# Patient Record
Sex: Female | Born: 1990 | Race: White | Hispanic: No | Marital: Single | Smoking: Never smoker
Health system: Southern US, Community
[De-identification: ages and names within clinical notes are randomized; demographics above are authoritative.]

## PROBLEM LIST (undated history)

## (undated) DIAGNOSIS — G43909 Migraine, unspecified, not intractable, without status migrainosus: Secondary | ICD-10-CM

## (undated) DIAGNOSIS — B009 Herpesviral infection, unspecified: Secondary | ICD-10-CM

## (undated) DIAGNOSIS — F32A Depression, unspecified: Secondary | ICD-10-CM

## (undated) DIAGNOSIS — F419 Anxiety disorder, unspecified: Secondary | ICD-10-CM

## (undated) DIAGNOSIS — F329 Major depressive disorder, single episode, unspecified: Secondary | ICD-10-CM

## (undated) HISTORY — PX: NO PAST SURGERIES: SHX2092

## (undated) HISTORY — DX: Herpesviral infection, unspecified: B00.9

## (undated) HISTORY — DX: Migraine, unspecified, not intractable, without status migrainosus: G43.909

---
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
## 2012-05-16 ENCOUNTER — Emergency Department (HOSPITAL_COMMUNITY)
Admission: EM | Admit: 2012-05-16 | Discharge: 2012-05-16 | Disposition: A | Discharge status: Home / Self Care | Payer: Medicaid Other | Attending: Emergency Medicine | Admitting: Emergency Medicine

## 2012-05-16 ENCOUNTER — Encounter (HOSPITAL_COMMUNITY): Payer: Self-pay | Admitting: *Deleted

## 2012-05-16 DIAGNOSIS — T169XXA Foreign body in ear, unspecified ear, initial encounter: Secondary | ICD-10-CM

## 2012-05-16 DIAGNOSIS — H9209 Otalgia, unspecified ear: Secondary | ICD-10-CM | POA: Insufficient documentation

## 2012-05-16 NOTE — ED Notes (Signed)
Pt denies symptoms at this time.  Discharged.

## 2012-05-16 NOTE — ED Notes (Signed)
Per parent, Pt woke up tonight with left ear hurting, pt also c/o dizziness and "feels like something is crawling down in her ear".

## 2012-05-16 NOTE — ED Provider Notes (Signed)
History     CSN: [id number]  Arrival date & time 05/16/12  0443   First MD Initiated Contact with Patient 05/16/12 0451      Chief Complaint  Patient presents with  . Earache     (Consider location/radiation/quality/duration/timing/severity/associated sxs/prior treatment) HPI This is an 21 year old white female who awoke just prior to arrival with severe pain in her left ear. She states it felt like something was crawling in her ear. This was accompanied by sensation of the room spinning. She became very anxious and hyperventilated. She had a near syncopal episode. On the way to the ED the symptoms resolved. She is without pain or other symptoms at the present time.  History reviewed. No pertinent past medical history.  History reviewed. No pertinent past surgical history.  History reviewed. No pertinent family history.  History  Substance Use Topics  . Smoking status: Never Smoker   . Smokeless tobacco: Not on file  . Alcohol Use: No    OB History    Grav Para Term Preterm Abortions TAB SAB Ect Mult Living                  Review of Systems  All other systems reviewed and are negative.    Allergies  Review of patient's allergies indicates no known allergies.  Home Medications   Current Outpatient Rx  Name Route Sig Dispense Refill  . NORETHIN ACE-ETH ESTRAD-FE 1-20 MG-MCG PO TABS Oral Take 1 tablet by mouth daily.      BP 134/65  Pulse 80  Temp 98 F (36.7 C) (Oral)  Resp 20  SpO2 100%  LMP 05/09/2012  Physical Exam General: Well-developed, well-nourished female in no acute distress; appearance consistent with age of record HENT: normocephalic, atraumatic; tympanic membranes normal bilaterally; no foreign objects seen in ears; no pain on movement of external ears; no drainage, bleeding or edema of the external auditory canal seen Eyes: pupils equal round and reactive to light; extraocular muscles intact Neck: supple Heart: regular rate and  rhythm Lungs: Normal respiratory effort and excursion Abdomen: soft; nondistended Extremities: No deformity; full range of motion Neurologic: Awake, alert and oriented; motor function intact in all extremities and symmetric; no facial droop Skin: Warm and dry     ED Course  Procedures (including critical care time)     MDM  Suspect patient had a insect in her ear that subsequently exited, hence the sudden onset and resolution of symptoms.         Carlisle Beers Cara Thaxton, MD 05/16/12 0500

## 2012-12-19 ENCOUNTER — Encounter (HOSPITAL_COMMUNITY): Payer: Self-pay

## 2012-12-19 ENCOUNTER — Emergency Department (HOSPITAL_COMMUNITY)
Admission: EM | Admit: 2012-12-19 | Discharge: 2012-12-19 | Disposition: A | Discharge status: Home / Self Care | Payer: BC Managed Care – PPO | Attending: Emergency Medicine | Admitting: Emergency Medicine

## 2012-12-19 DIAGNOSIS — B009 Herpesviral infection, unspecified: Secondary | ICD-10-CM | POA: Insufficient documentation

## 2012-12-19 DIAGNOSIS — Z79899 Other long term (current) drug therapy: Secondary | ICD-10-CM | POA: Insufficient documentation

## 2012-12-19 MED ORDER — ACYCLOVIR 800 MG PO TABS: 800.0000 mg | ORAL_TABLET | Freq: Every day | ORAL | Status: DC

## 2012-12-19 NOTE — ED Notes (Signed)
Pt with rash to right axillary, pt states at first it itched but now is painful, states ongoing since Friday, mother states that PCP stated it was a bug bite

## 2012-12-19 NOTE — ED Notes (Signed)
Rash to right axillary area since Friday. Itching at first and now just burns.

## 2012-12-20 NOTE — ED Provider Notes (Signed)
History     CSN: [id number]  Arrival date & time 12/19/12  1258   First MD Initiated Contact with Patient 12/19/12 1316      Chief Complaint  Patient presents with  . Rash    (Consider location/radiation/quality/duration/timing/severity/associated sxs/prior treatment) HPI Comments: Patient c/o painful, red, itching rash to her right axilla.  States she had itching to the same area prior to the rash.  Describes the pain as a burning , stinging sensation.  She denies recent fever, malaise, chills   Patient is a 22 y.o. female presenting with rash. The history is provided by the patient and a parent.  Rash  This is a new problem. Episode onset: 2-3 days. The problem has not changed since onset.The problem is associated with an unknown factor. There has been no fever. The rash is present on the torso. The pain is mild. The pain has been constant since onset. Associated symptoms include blisters, itching and pain. Pertinent negatives include no weeping. She has tried nothing for the symptoms. The treatment provided no relief.    History reviewed. No pertinent past medical history.  History reviewed. No pertinent past surgical history.  No family history on file.  History  Substance Use Topics  . Smoking status: Never Smoker   . Smokeless tobacco: Not on file  . Alcohol Use: No    OB History    Grav Para Term Preterm Abortions TAB SAB Ect Mult Living                  Review of Systems  Constitutional: Negative for fever, chills, activity change and appetite change.  HENT: Negative for sore throat, facial swelling, trouble swallowing, neck pain and neck stiffness.   Respiratory: Negative for chest tightness, shortness of breath and wheezing.   Musculoskeletal: Negative for myalgias, back pain, joint swelling, arthralgias and gait problem.  Skin: Positive for color change, itching and rash. Negative for wound.  Neurological: Negative for dizziness, weakness, numbness and  headaches.  All other systems reviewed and are negative.    Allergies  Review of patient's allergies indicates no known allergies.  Home Medications   Current Outpatient Rx  Name  Route  Sig  Dispense  Refill  . LISDEXAMFETAMINE DIMESYLATE 30 MG PO CAPS   Oral   Take 30 mg by mouth every morning.         Marland Kitchen MEDROXYPROGESTERONE ACETATE 150 MG/ML IM SUSP   Intramuscular   Inject 150 mg into the muscle every 3 (three) months.         . ACYCLOVIR 800 MG PO TABS   Oral   Take 1 tablet (800 mg total) by mouth 5 (five) times daily. For 7 days   35 tablet   0     BP 111/65  Pulse 65  Temp 98 F (36.7 C) (Oral)  Resp 20  Ht 5\' 1"  (1.549 m)  Wt 100 lb (45.36 kg)  BMI 18.89 kg/m2  SpO2 100%  Physical Exam  Nursing note and vitals reviewed. Constitutional: She is oriented to person, place, and time. She appears well-developed and well-nourished. No distress.  HENT:  Head: Normocephalic and atraumatic.  Mouth/Throat: Oropharynx is clear and moist.  Neck: Normal range of motion. Neck supple.  Cardiovascular: Normal rate, regular rhythm and normal heart sounds.   Pulmonary/Chest: Effort normal and breath sounds normal.  Musculoskeletal: She exhibits no edema and no tenderness.  Lymphadenopathy:    She has no cervical adenopathy.  Neurological: She is  alert and oriented to person, place, and time. She exhibits normal muscle tone. Coordination normal.  Skin: Rash noted. There is erythema.          group of vesicular lesions to the right lateral axilla.  Few satellite red papules also present    ED Course  Procedures (including critical care time)  Labs Reviewed - No data to display No results found.   1. Herpes infection       MDM    Localized , group of vesicular lesions to the right lateral axilla.  Few satellite red papules also present extending to the scapula.  Appear c/w herpes infection.  Patient advised that shingles is possible although unlikely at  her age.  Patient is otherwise well appearing and vitals are stable.  No other lesions seen on exam.  Will  Begin antiviral medication and pt agrees to f/u with her PMD if needed      Arish Redner L. [place], [place] 12/20/12 2214

## 2012-12-21 NOTE — ED Provider Notes (Signed)
Medical screening examination/treatment/procedure(s) were performed by non-physician practitioner and as supervising physician I was immediately available for consultation/collaboration. Devoria Albe, MD, FACEP   Ward Givens, MD 12/21/12 [phone]

## 2013-04-18 ENCOUNTER — Ambulatory Visit: Payer: Self-pay | Admitting: Pediatrics

## 2014-05-02 ENCOUNTER — Ambulatory Visit (INDEPENDENT_AMBULATORY_CARE_PROVIDER_SITE_OTHER): Payer: BC Managed Care – PPO | Admitting: Pediatrics

## 2014-05-02 ENCOUNTER — Emergency Department (HOSPITAL_COMMUNITY)
Admission: EM | Admit: 2014-05-02 | Discharge: 2014-05-02 | Disposition: A | Discharge status: Home / Self Care | Payer: BC Managed Care – PPO | Attending: Emergency Medicine | Admitting: Emergency Medicine

## 2014-05-02 ENCOUNTER — Encounter (HOSPITAL_COMMUNITY): Payer: Self-pay | Admitting: Emergency Medicine

## 2014-05-02 ENCOUNTER — Encounter: Payer: Self-pay | Admitting: Pediatrics

## 2014-05-02 VITALS — BP 100/76 | Wt 103.4 lb

## 2014-05-02 DIAGNOSIS — Z3202 Encounter for pregnancy test, result negative: Secondary | ICD-10-CM | POA: Insufficient documentation

## 2014-05-02 DIAGNOSIS — F419 Anxiety disorder, unspecified: Secondary | ICD-10-CM

## 2014-05-02 DIAGNOSIS — Z79899 Other long term (current) drug therapy: Secondary | ICD-10-CM | POA: Insufficient documentation

## 2014-05-02 DIAGNOSIS — R000 Tachycardia, unspecified: Secondary | ICD-10-CM | POA: Insufficient documentation

## 2014-05-02 DIAGNOSIS — F329 Major depressive disorder, single episode, unspecified: Secondary | ICD-10-CM | POA: Insufficient documentation

## 2014-05-02 DIAGNOSIS — F3289 Other specified depressive episodes: Secondary | ICD-10-CM | POA: Insufficient documentation

## 2014-05-02 DIAGNOSIS — F411 Generalized anxiety disorder: Secondary | ICD-10-CM

## 2014-05-02 DIAGNOSIS — E860 Dehydration: Secondary | ICD-10-CM | POA: Insufficient documentation

## 2014-05-02 HISTORY — DX: Major depressive disorder, single episode, unspecified: F32.9

## 2014-05-02 HISTORY — DX: Anxiety disorder, unspecified: F41.9

## 2014-05-02 HISTORY — DX: Depression, unspecified: F32.A

## 2014-05-02 LAB — COMPREHENSIVE METABOLIC PANEL
ALT: 7 U/L (ref 0–35)
AST: 14 U/L (ref 0–37)
Albumin: 4.6 g/dL (ref 3.5–5.2)
Alkaline Phosphatase: 58 U/L (ref 39–117)
BUN: 9 mg/dL (ref 6–23)
CHLORIDE: 101 meq/L (ref 96–112)
CO2: 23 mEq/L (ref 19–32)
CREATININE: 0.75 mg/dL (ref 0.50–1.10)
Calcium: 9.3 mg/dL (ref 8.4–10.5)
GFR calc Af Amer: >90 mL/min (ref >90)
GFR calc non Af Amer: >90 mL/min (ref >90)
Glucose, Bld: 109 mg/dL — ABNORMAL HIGH (ref 70–99)
Potassium: 3.8 mEq/L (ref 3.7–5.3)
Sodium: 140 mEq/L (ref 137–147)
Total Bilirubin: 0.3 mg/dL (ref 0.3–1.2)
Total Protein: 7.5 g/dL (ref 6.0–8.3)

## 2014-05-02 LAB — CBC WITH DIFFERENTIAL/PLATELET
Basophils Absolute: 0 10*3/uL (ref 0.0–0.1)
Basophils Relative: 0 % (ref 0–1)
EOS ABS: 0 10*3/uL (ref 0.0–0.7)
Eosinophils Relative: 1 % (ref 0–5)
HCT: 37.1 % (ref 36.0–46.0)
Hemoglobin: 12.6 g/dL (ref 12.0–15.0)
LYMPHS ABS: 2.1 10*3/uL (ref 0.7–4.0)
Lymphocytes Relative: 32 % (ref 12–46)
MCH: 29.4 pg (ref 26.0–34.0)
MCHC: 34 g/dL (ref 30.0–36.0)
MCV: 86.7 fL (ref 78.0–100.0)
Monocytes Absolute: 0.4 10*3/uL (ref 0.1–1.0)
Monocytes Relative: 7 % (ref 3–12)
Neutro Abs: 4 10*3/uL (ref 1.7–7.7)
Neutrophils Relative %: 60 % (ref 43–77)
Platelets: 214 10*3/uL (ref 150–400)
RBC: 4.28 MIL/uL (ref 3.87–5.11)
RDW: 12.8 % (ref 11.5–15.5)
WBC: 6.6 10*3/uL (ref 4.0–10.5)

## 2014-05-02 LAB — URINALYSIS, ROUTINE W REFLEX MICROSCOPIC
Bilirubin Urine: NEGATIVE
GLUCOSE, UA: NEGATIVE mg/dL
Ketones, ur: 15 mg/dL — AB
Leukocytes, UA: NEGATIVE
Nitrite: NEGATIVE
Protein, ur: NEGATIVE mg/dL
Specific Gravity, Urine: >1.03 — ABNORMAL HIGH (ref 1.005–1.030)
UROBILINOGEN UA: 0.2 mg/dL (ref 0.0–1.0)
pH: 5.5 (ref 5.0–8.0)

## 2014-05-02 LAB — PREGNANCY, URINE: Preg Test, Ur: NEGATIVE

## 2014-05-02 LAB — URINE MICROSCOPIC-ADD ON

## 2014-05-02 MED ORDER — PAROXETINE HCL 20 MG PO TABS: 20.0000 mg | ORAL_TABLET | Freq: Every day | ORAL | Status: DC

## 2014-05-02 MED ORDER — LORAZEPAM 1 MG PO TABS
1.0000 mg | ORAL_TABLET | Freq: Once | ORAL | Status: AC
  Administered 2014-05-02: 1 mg via ORAL
  Filled 2014-05-02: qty 1

## 2014-05-02 MED ORDER — SODIUM CHLORIDE 0.9 % IV BOLUS (SEPSIS)
1000.0000 mL | Freq: Once | INTRAVENOUS | Status: AC
  Administered 2014-05-02: 1000 mL via INTRAVENOUS

## 2014-05-02 MED ORDER — HYDROXYZINE HCL 25 MG PO TABS: ORAL_TABLET | ORAL | Status: DC

## 2014-05-02 NOTE — Discharge Instructions (Signed)
Follow up with your md next week for recheck °

## 2014-05-02 NOTE — Patient Instructions (Signed)
Generalized Anxiety Disorder  Generalized anxiety disorder (GAD) is a mental disorder. It interferes with life functions, including relationships, work, and school.  GAD is different from normal anxiety, which everyone experiences at some point in their lives in response to specific life events and activities. Normal anxiety actually helps us prepare for and get through these life events and activities. Normal anxiety goes away after the event or activity is over.   GAD causes anxiety that is not necessarily related to specific events or activities. It also causes excess anxiety in proportion to specific events or activities. The anxiety associated with GAD is also difficult to control. GAD can vary from mild to severe. People with severe GAD can have intense waves of anxiety with physical symptoms (panic attacks).   SYMPTOMS  The anxiety and worry associated with GAD are difficult to control. This anxiety and worry are related to many life events and activities and also occur more days than not for 6 months or longer. People with GAD also have three or more of the following symptoms (one or more in children):  · Restlessness.    · Fatigue.  · Difficulty concentrating.    · Irritability.  · Muscle tension.  · Difficulty sleeping or unsatisfying sleep.  DIAGNOSIS  GAD is diagnosed through an assessment by your caregiver. Your caregiver will ask you questions about your mood, physical symptoms, and events in your life. Your caregiver may ask you about your medical history and use of alcohol or drugs, including prescription medications. Your caregiver may also do a physical exam and blood tests. Certain medical conditions and the use of certain substances can cause symptoms similar to those associated with GAD. Your caregiver may refer you to a mental health specialist for further evaluation.  TREATMENT  The following therapies are usually used to treat GAD:   · Medication--Antidepressant medication usually is  prescribed for long-term daily control. Antianxiety medications may be added in severe cases, especially when panic attacks occur.    · Talk therapy (psychotherapy)--Certain types of talk therapy can be helpful in treating GAD by providing support, education, and guidance. A form of talk therapy called cognitive behavioral therapy can teach you healthy ways to think about and react to daily life events and activities.  · Stress management techniques--These include yoga, meditation, and exercise and can be very helpful when they are practiced regularly.  A mental health specialist can help determine which treatment is best for you. Some people see improvement with one therapy. However, other people require a combination of therapies.  Document Released: 02/25/2013 Document Reviewed: 02/25/2013  [employer]® Patient Information ©2015 [employer]. This information is not intended to replace advice given to you by your health care provider. Make sure you discuss any questions you have with your health care provider.

## 2014-05-02 NOTE — Progress Notes (Signed)
Subjective:     Haley Morales is a 23 y.o. female who presents for new evaluation and treatment of anxiety disorder and panic attacks. She has the following anxiety symptoms: difficulty concentrating, feelings of losing control, insomnia, palpitations and panic attacks. Onset of symptoms was approximately 3days ago. Symptoms have been gradually worsening since that time. She denies current suicidal and homicidal ideation. Family history significant for anxiety. Risk factors: uncle died 1 week ago.. Previous treatment includes none. She complains of the following medication side effects: none. The following portions of the patient's history were reviewed and updated as appropriate: allergies, current medications, past family history, past medical history, past social history, past surgical history and problem list.  Review of Systems Pertinent items are noted in HPI.    Objective:    BP 100/76  Wt 103 lb 6.4 oz (46.902 kg)  General Appearance:    Alert, cooperative, no distress, appears stated age  Head:    Normocephalic, without obvious abnormality, atraumatic  Eyes:    PERRL, conjunctiva/corneas clear, EOM's intact, fundi    benign, both eyes  Ears:    Normal TM's and external ear canals, both ears  Nose:   Nares normal, septum midline, mucosa normal, no drainage    or sinus tenderness  Throat:   Lips, mucosa, and tongue normal; teeth and gums normal  Neck:   Supple, symmetrical, trachea midline, no adenopathy;    thyroid:  no enlargement/tenderness/nodules; no carotid   bruit or JVD  Back:     Symmetric, no curvature, ROM normal, no CVA tenderness  Lungs:     Clear to auscultation bilaterally, respirations unlabored      Heart:   rrr , no m.       Breast Exam:    No tenderness, masses, or nipple abnormality  Abdomen:     Soft, non-tender, bowel sounds active all four quadrants,    no masses, no organomegaly        Extremities:   Extremities normal, atraumatic, no cyanosis or  edema     Skin:   Skin color, texture, turgor normal, no rashes or lesions  Lymph nodes:   Cervical, supraclavicular, and axillary nodes normal  Neurologic:   CNII-XII intact, normal strength, sensation and reflexes    throughout      Assessment:    anxiety disorder. Possible organic contributing causes are: uncle died 1 week ago, family hx of anxiety..   Plan:     paxil 20mg  qd. Instructed to call or rto if not better or worsening sx.

## 2014-05-02 NOTE — ED Notes (Signed)
Pt reports started paxil today and c/o feeling lightheaded and difficulty swallowing.  Denies any difficulty breathing or feeling like throat is swelling.

## 2014-05-02 NOTE — ED Provider Notes (Signed)
CSN: [id number]     Arrival date & time 05/02/14  1612 History   First MD Initiated Contact with Patient 05/02/14 1629    This chart was scribed for Benny LennertJoseph L Zammit, MD by Marica OtterNusrat Rahman, ED Scribe. This patient was seen in room APA07/APA07 and the patient's care was started at 4:35 PM.  Chief Complaint  Patient presents with  . dizziness     Patient is a 23 y.o. female presenting with dizziness. The history is provided by the patient. No language interpreter was used.  Dizziness Quality:  Lightheadedness Severity:  Moderate Onset quality:  Sudden Timing:  Constant Chronicity:  New Context: medication (Sx began following taking Paxil )   Associated symptoms: no shortness of breath   Associated symptoms comment:  Feeling shaky; difficulty swallowing; numbness in hands Risk factors: new medications (Paxil 20 MG started today)    HPI Comments: Haley Morales is a 23 y.o. female who presents to the Emergency Department complaining of lightheadedness with associated difficulty swallowing, numbness in her hands, and feeling "shaky" onset today after taking her first dose of Paxil 20 MG. Pt denies experiencing any of these Sx prior to taking Paxil. Pt denies SOB or swelling of the throat.   Pt reports she had an appointment with her PCP earlier today because she "felt like [she] was going to faint" and had loss of appetite for the past 2 days. Subsequently, her PCP started her on Paxil. Pt denies any labs or other tests were done at her PCP's office.   PCP: Arnaldo NatalJack Flippo, MD   Past Medical History  Diagnosis Date  . Anxiety   . Depression    History reviewed. No pertinent past surgical history. No family history on file. History  Substance Use Topics  . Smoking status: Never Smoker   . Smokeless tobacco: Not on file  . Alcohol Use: No   OB History   Grav Para Term Preterm Abortions TAB SAB Ect Mult Living                 Review of Systems  Constitutional: Positive for appetite  change (loss of appetitie). Negative for fatigue.  HENT: Negative for congestion, ear discharge and sinus pressure.   Eyes: Negative for discharge.  Respiratory: Negative for cough and shortness of breath.   Gastrointestinal: Negative for abdominal pain.  Genitourinary: Negative for frequency and hematuria.  Musculoskeletal: Negative for back pain.  Skin: Negative for rash.  Neurological: Positive for dizziness and numbness (hands). Negative for seizures.       Feeling "shaky" and sensation that she was going to faint  Psychiatric/Behavioral: Negative for hallucinations. The patient is nervous/anxious (anxious).       Allergies  Review of patient's allergies indicates no known allergies.  Home Medications   Prior to Admission medications   Medication Sig Start Date End Date Taking? Authorizing Provider  acyclovir (ZOVIRAX) 800 MG tablet Take 1 tablet (800 mg total) by mouth 5 (five) times daily. For 7 days 12/19/12   Tammy L. Triplett, PA-C  lisdexamfetamine (VYVANSE) 30 MG capsule Take 30 mg by mouth every morning.    Historical Provider, MD  medroxyPROGESTERone (DEPO-PROVERA) 150 MG/ML injection Inject 150 mg into the muscle every 3 (three) months.    Historical Provider, MD  PARoxetine (PAXIL) 20 MG tablet Take 1 tablet (20 mg total) by mouth daily. 05/02/14   Arnaldo NatalJack Flippo, MD   Triage Vitals: BP 151/82  Pulse 122  Temp(Src) 98.1 F (36.7 C) (Oral)  Resp 20  Ht 5\' 1"  (1.549 m)  Wt 104 lb (47.174 kg)  BMI 19.66 kg/m2  SpO2 100% Physical Exam  Constitutional: She is oriented to person, place, and time. She appears well-developed.  HENT:  Head: Normocephalic.  Eyes: Conjunctivae and EOM are normal. No scleral icterus.  Neck: Neck supple. No thyromegaly present.  Cardiovascular: Exam reveals no gallop and no friction rub.   No murmur heard. tacchycardic  Pulmonary/Chest: No stridor. She has no wheezes. She has no rales. She exhibits no tenderness.  Abdominal: She exhibits no  distension. There is no tenderness. There is no rebound.  Musculoskeletal: Normal range of motion. She exhibits no edema.  Lymphadenopathy:    She has no cervical adenopathy.  Neurological: She is oriented to person, place, and time. She exhibits normal muscle tone. Coordination normal.  Skin: No rash noted. No erythema.  Psychiatric:  anxious    ED Course  Procedures (including critical care time) DIAGNOSTIC STUDIES: Oxygen Saturation is 100% on RA, normal by my interpretation.    COORDINATION OF CARE: 4:39 PM-Discussed treatment plan which includes EKG and labs with pt at bedside and pt agreed to plan.  6:28PM: Recheck discussed lab results and IV fluids with pt. Pt agreeable to receiving IV fluids.  Labs Review Labs Reviewed - No data to display  Imaging Review No results found.   EKG Interpretation None      MDM   Final diagnoses:  None    The chart was scribed for me under my direct supervision.  I personally performed the history, physical, and medical decision making and all procedures in the evaluation of this patient.Benny Lennert.   Joseph L Zammit, MD 05/02/14 [phone]

## 2014-05-05 ENCOUNTER — Encounter: Payer: Self-pay | Admitting: Pediatrics

## 2014-05-05 ENCOUNTER — Ambulatory Visit (INDEPENDENT_AMBULATORY_CARE_PROVIDER_SITE_OTHER): Payer: BC Managed Care – PPO | Admitting: Pediatrics

## 2014-05-05 VITALS — BP 112/70 | HR 88 | Ht 63.31 in | Wt 100.8 lb

## 2014-05-05 DIAGNOSIS — F411 Generalized anxiety disorder: Secondary | ICD-10-CM

## 2014-05-05 MED ORDER — BUSPIRONE HCL 15 MG PO TABS: 7.5000 mg | ORAL_TABLET | Freq: Two times a day (BID) | ORAL | Status: DC

## 2014-05-05 NOTE — Progress Notes (Signed)
Subjective:     Hyman BibleMalanie B Collins is a 23 y.o. female who presents for follow up of anxiety disorder. She ha the following anxiety symptoms: difficulty concentrating, dizziness, insomnia, panic attacks and paresthesias. Onset of symptoms was approximately 3 days ago. Symptoms have been gradually worsening since that time. She denies current suicidal and homicidal ideation. Family history significant for anxiety. Risk factors: positive family history in  mother. Previous treatment includes Paxil. She complains of the following medication side effects: dizziness and nervousness. The following portions of the patient's history were reviewed and updated as appropriate: allergies, current medications, past family history, past medical history, past social history, past surgical history and problem list.  Review of Systems Pertinent items are noted in HPI.    Objective:    BP 112/70  Pulse 88  Ht 5' 3.31" (1.608 m)  Wt 100 lb 12.8 oz (45.723 kg)  BMI 17.68 kg/m2  General Appearance:    Alert, cooperative, no distress, appears stated age  Head:    Normocephalic, without obvious abnormality, atraumatic              Neck:   Supple, symmetrical, trachea midline, no adenopathy;    thyroid:  no enlargement/tenderness/nodules; no carotid   bruit or JVD  Back:     Symmetric, no curvature, ROM normal, no CVA tenderness  Lungs:     Clear to auscultation bilaterally, respirations unlabored      Heart:    Regular rate and rhythm, S1 and S2 normal, no murmur, rub   or gallop     Abdomen:     Soft, non-tender, bowel sounds active all four quadrants,    no masses, no organomegaly        Extremities:   Extremities normal, atraumatic, no cyanosis or edema  Pulses:   2+ and symmetric all extremities  Skin:   Skin color, texture, turgor normal, no rashes or lesions            Assessment:    anxiety disorder. Possible organic contributing causes are: uncle died 1 week ago..   Plan:    Medications: BuSpar.   She is to call me and report how she feels on the new med.

## 2014-05-05 NOTE — Patient Instructions (Signed)
Generalized Anxiety Disorder  Generalized anxiety disorder (GAD) is a mental disorder. It interferes with life functions, including relationships, work, and school.  GAD is different from normal anxiety, which everyone experiences at some point in their lives in response to specific life events and activities. Normal anxiety actually helps us prepare for and get through these life events and activities. Normal anxiety goes away after the event or activity is over.   GAD causes anxiety that is not necessarily related to specific events or activities. It also causes excess anxiety in proportion to specific events or activities. The anxiety associated with GAD is also difficult to control. GAD can vary from mild to severe. People with severe GAD can have intense waves of anxiety with physical symptoms (panic attacks).   SYMPTOMS  The anxiety and worry associated with GAD are difficult to control. This anxiety and worry are related to many life events and activities and also occur more days than not for 6 months or longer. People with GAD also have three or more of the following symptoms (one or more in children):  · Restlessness.    · Fatigue.  · Difficulty concentrating.    · Irritability.  · Muscle tension.  · Difficulty sleeping or unsatisfying sleep.  DIAGNOSIS  GAD is diagnosed through an assessment by your caregiver. Your caregiver will ask you questions about your mood, physical symptoms, and events in your life. Your caregiver may ask you about your medical history and use of alcohol or drugs, including prescription medications. Your caregiver may also do a physical exam and blood tests. Certain medical conditions and the use of certain substances can cause symptoms similar to those associated with GAD. Your caregiver may refer you to a mental health specialist for further evaluation.  TREATMENT  The following therapies are usually used to treat GAD:   · Medication--Antidepressant medication usually is  prescribed for long-term daily control. Antianxiety medications may be added in severe cases, especially when panic attacks occur.    · Talk therapy (psychotherapy)--Certain types of talk therapy can be helpful in treating GAD by providing support, education, and guidance. A form of talk therapy called cognitive behavioral therapy can teach you healthy ways to think about and react to daily life events and activities.  · Stress management techniques--These include yoga, meditation, and exercise and can be very helpful when they are practiced regularly.  A mental health specialist can help determine which treatment is best for you. Some people see improvement with one therapy. However, other people require a combination of therapies.  Document Released: 02/25/2013 Document Reviewed: 02/25/2013  [employer]® Patient Information ©2015 [employer]. This information is not intended to replace advice given to you by your health care provider. Make sure you discuss any questions you have with your health care provider.

## 2014-05-06 ENCOUNTER — Telehealth: Payer: Self-pay

## 2014-05-06 NOTE — Telephone Encounter (Signed)
Patient feels that the medication is making it worse.  States that she has taken 4-5 doses and only slept about 2 hours.

## 2014-05-07 ENCOUNTER — Telehealth: Payer: Self-pay | Admitting: Pediatrics

## 2014-05-07 NOTE — Telephone Encounter (Signed)
Pt called inquiring about Rx for Xanax, Per Dr. Debbora PrestoFlippo he wants to ref'r to psych.  Patient given info for [facility] [place].

## 2014-06-20 ENCOUNTER — Ambulatory Visit (INDEPENDENT_AMBULATORY_CARE_PROVIDER_SITE_OTHER): Payer: BC Managed Care – PPO | Admitting: Pediatrics

## 2014-06-20 ENCOUNTER — Encounter: Payer: Self-pay | Admitting: Pediatrics

## 2014-06-20 VITALS — BP 110/76 | Temp 99.0°F | Wt 102.0 lb

## 2014-06-20 DIAGNOSIS — J029 Acute pharyngitis, unspecified: Secondary | ICD-10-CM

## 2014-06-20 DIAGNOSIS — J209 Acute bronchitis, unspecified: Secondary | ICD-10-CM

## 2014-06-20 LAB — POCT RAPID STREP A (OFFICE): Rapid Strep A Screen: NEGATIVE

## 2014-06-20 MED ORDER — AZITHROMYCIN 250 MG PO TABS: ORAL_TABLET | ORAL | Status: AC

## 2014-06-20 MED ORDER — HYDROCOD POLST-CHLORPHEN POLST 10-8 MG/5ML PO LQCR: 5.0000 mL | Freq: Two times a day (BID) | ORAL | Status: DC | PRN

## 2014-06-20 NOTE — Patient Instructions (Signed)

## 2014-06-20 NOTE — Progress Notes (Signed)
Acute Bronchitis Patient presents for evaluation of productive cough, sore throat and General malaise. Symptoms began 2 days ago and are gradually worsening since that time. Past history is significant for nothing.     General:   alert and cooperative  Gait:   normal  Skin:   normal  Oral cavity:   lips, mucosa, and tongue normal; teeth and gums normal  Eyes:   sclerae white, pupils equal and reactive  Ears:   normal bilaterally  Neck:   no adenopathy and supple, symmetrical, trachea midline  Lungs:  clear to auscultation bilaterally  Heart:   regular rate and rhythm, S1, S2 normal, no murmur, click, rub or gallop  Abdomen:  soft, non-tender; bowel sounds normal; no masses,  no organomegaly             Assessment: Acute bronchitis Plan: Desenex for cough, Z-Pak

## 2014-08-04 ENCOUNTER — Other Ambulatory Visit: Payer: Self-pay | Admitting: Pediatrics

## 2014-08-04 DIAGNOSIS — F411 Generalized anxiety disorder: Secondary | ICD-10-CM

## 2014-08-04 MED ORDER — BUSPIRONE HCL 15 MG PO TABS: 7.5000 mg | ORAL_TABLET | Freq: Two times a day (BID) | ORAL | Status: DC

## 2014-08-05 ENCOUNTER — Other Ambulatory Visit: Payer: Self-pay | Admitting: *Deleted

## 2014-08-05 NOTE — Telephone Encounter (Signed)
Fax received from [place] for refill on Buspirone 15 mg. Tab. # 30. Take1/2 tab. By mouth 2 times daily.Total of 3 refills granted per Dr. Debbora Presto. knl

## 2014-08-12 ENCOUNTER — Ambulatory Visit (INDEPENDENT_AMBULATORY_CARE_PROVIDER_SITE_OTHER): Payer: BC Managed Care – PPO | Admitting: Pediatrics

## 2014-08-12 VITALS — BP 110/70 | Ht 63.5 in | Wt 105.0 lb

## 2014-08-12 DIAGNOSIS — F411 Generalized anxiety disorder: Secondary | ICD-10-CM

## 2014-08-12 DIAGNOSIS — G4700 Insomnia, unspecified: Secondary | ICD-10-CM

## 2014-08-13 ENCOUNTER — Encounter: Payer: Self-pay | Admitting: Pediatrics

## 2014-08-13 DIAGNOSIS — F418 Other specified anxiety disorders: Secondary | ICD-10-CM | POA: Insufficient documentation

## 2014-08-13 MED ORDER — BUSPIRONE HCL 15 MG PO TABS: 15.0000 mg | ORAL_TABLET | Freq: Two times a day (BID) | ORAL | Status: DC

## 2014-08-13 MED ORDER — TRAZODONE 25 MG HALF TABLET: 25.0000 mg | ORAL_TABLET | Freq: Every day | ORAL | Status: DC

## 2014-08-13 NOTE — Progress Notes (Signed)
   Subjective:    Patient ID: Haley Morales, female    DOB: Dec 24, 1990, 23 y.o.   MRN: [id number]  HPI 23 year old female here for worsening anxiety. She is on BuSpar and works fair. Also having trouble at night sleeping. Denies depression or wanting to harm herself. Appetite okay. She is working and is not having any social anxiety. No nausea vomiting headaches or diarrhea    Review of Systems as per history of present illness     Objective:   Physical Exam Alert oriented no distress Ears TMs normal Throat: Clear Neck no thyromegaly or adenopathy Lungs: Clear to auscultation Heart regular rate and rhythm without murmur Abdomen soft nontender       Assessment & Plan:  Generalized anxiety Plan: Increase her BuSpar to 15 mg twice a day and added trazodone 25 mg at bedtime Call if any problems

## 2016-08-23 ENCOUNTER — Other Ambulatory Visit (HOSPITAL_COMMUNITY): Payer: Self-pay | Admitting: Family Medicine

## 2016-08-23 ENCOUNTER — Ambulatory Visit (HOSPITAL_COMMUNITY)
Admission: RE | Admit: 2016-08-23 | Discharge: 2016-08-23 | Disposition: A | Discharge status: Home / Self Care | Payer: BLUE CROSS/BLUE SHIELD | Source: Ambulatory Visit | Attending: Family Medicine | Admitting: Family Medicine

## 2016-08-23 DIAGNOSIS — G44209 Tension-type headache, unspecified, not intractable: Secondary | ICD-10-CM | POA: Insufficient documentation

## 2016-08-23 DIAGNOSIS — Z1389 Encounter for screening for other disorder: Secondary | ICD-10-CM | POA: Diagnosis not present

## 2016-08-23 DIAGNOSIS — Z6820 Body mass index (BMI) 20.0-20.9, adult: Secondary | ICD-10-CM | POA: Diagnosis not present

## 2016-08-23 DIAGNOSIS — G44201 Tension-type headache, unspecified, intractable: Secondary | ICD-10-CM

## 2016-08-23 DIAGNOSIS — R51 Headache: Secondary | ICD-10-CM | POA: Diagnosis not present

## 2016-11-14 NOTE — L&D Delivery Note (Signed)
Patient is 26 y.o. G1P0 [id number] admitted for PPROM. She progressed to active labor.   Delivery Note At 3:39 PM a viable female was delivered via Vaginal, Spontaneous Delivery (Presentation:LOA).  APGAR: per nursing documentation. Weight pending.  Placenta status: spontaneous, intact.  Cord: 3 vessel   Anesthesia:  Epidural Episiotomy:  N/A Lacerations:  N/A Suture Repair: N/A Est. Blood Loss (mL):  250  Mom to postpartum.  Baby to NICU.     Upon arrival patient was complete and pushing. She pushed with good maternal effort to deliver a viable female infant in cephalic, LOA position. Nuchal cord present, easily reduced. Baby delivered without difficulty, was noted to have good tone and placed on maternal abdomen for oral suctioning, drying and stimulation. Delayed cord clamping performed. Placenta delivered spontaneously with gentle cord traction. Fundus firm with massage and Pitocin. Perineum inspected and found to have no lacerations. Counts of sharps, instruments, and lap pads were all correct.  Jearld LeschMary K Deone Omahoney DO PGY-2 07/22/2017, 3:58 PM

## 2016-12-12 ENCOUNTER — Encounter: Payer: Self-pay | Admitting: Obstetrics & Gynecology

## 2016-12-12 ENCOUNTER — Ambulatory Visit (INDEPENDENT_AMBULATORY_CARE_PROVIDER_SITE_OTHER): Payer: BLUE CROSS/BLUE SHIELD | Admitting: Obstetrics & Gynecology

## 2016-12-12 VITALS — BP 128/80 | HR 90 | Ht 61.0 in | Wt 108.0 lb

## 2016-12-12 DIAGNOSIS — N970 Female infertility associated with anovulation: Secondary | ICD-10-CM | POA: Diagnosis not present

## 2016-12-12 MED ORDER — CLOMIPHENE CITRATE 50 MG PO TABS: 50.0000 mg | ORAL_TABLET | Freq: Every day | ORAL | 5 refills | Status: DC

## 2016-12-12 NOTE — Progress Notes (Signed)
Subjective:    Haley Morales is a 26 y.o. female who presents for evaluation of infertility. Patient and partner have been attempting conception for 1 year. Marital status: married for 2 years. Pregnancies with current partner: no. G0P0 Off BCM since 06/2015 was on Depo, had been on for 1 year Prior to that was using OCP  Her husband has 3 kids  Menstrual and Endocrine History LMP Patient's last menstrual period was 11/21/2016.  Menarche 15  Shortest interval 26  Longest interval 40  days  Duration of flow 4 days  Heavy menses yes  Clots yes  Intermenstrual bleeding no  Postcoital bleeding no  Dysmenorrhea first and second day  Amenorrhea no  Weight change no  Hirsutism no  Balding no  Acne no  Galactorrhea no   Obstetrical History Never pregnant  Gynecologic History Last PAP 2015  Previous abdominal or pelvic surgery no  Pelvic pain no  Endometriosis no  Hot flashes no  DES exposure no  Abnormal Pap no  Cervix Cryo/cone no  Sexually transmitted diseases no  Pelvic inflammatory disease no   Infertility and Endocrine Studies Basal body temperature   Endo with biopsy   Hysterosalpingogram   Post-coital test   Laparoscopy   Hormonal studies   Semen analysis   Other studies   Medications   Other therapies   Insemination    Sexual History Frequency 3 or 4 times per week  Satisfied yes  Dyspareunia no  Use of lubricant no  Douching no  Number of lifetime sex partners 5   Contraception None  Family History Thyroid problems  no  Heart condition or high blood pressure  unknown  Blood clot or stroke  no  Diabetes  no  Cancer  no  Birth defects/inherited diseases  no  Infectious diseases (mumps, TB, rubella)  no  Other medical problems  no   Habits Cigarettes:    Wife -  no    Husband - no Alcohol:    Wife -  1 time per month, 1-2 beers    Husband - same Marijuana:   Wife - no   Husband - no  The following portions of the patient's  history were reviewed and updated as appropriate: allergies, current medications, past family history, past medical history, past social history, past surgical history and problem list.  Review of Systems Pertinent items are noted in HPI.  Female History and Exam Name:       Age: 34 Education:   Marital History: married # of years with this partner:  # of partners:   Paternity of Pregnancies: Number with this partner:  Number with other partners:  Age of youngest child:   Urologic History: Infection   STD   Mumps   Varicocele   Semen analysis   Undescended testes   Testicular trauma   Genital surgery   Ejaculatory problem   Impotence          Objective:    Female Exam BP 128/80   Pulse 90   Ht 5\' 1"  (1.549 m)   Wt 108 lb (49 kg)   LMP 11/21/2016   BMI 20.41 kg/m  Wt Readings from Last 1 Encounters:  12/12/16 108 lb (49 kg)   BMI: Body mass index is 20.41 kg/m. BP 128/80   Pulse 90   Ht 5\' 1"  (1.549 m)   Wt 108 lb (49 kg)   LMP 11/21/2016   BMI 20.41 kg/m   General Appearance:    Alert,  cooperative, no distress, appears stated age  Head:    Normocephalic, without obvious abnormality, atraumatic  Eyes:    PERRL, conjunctiva/corneas clear, EOM's intact, fundi    benign, both eyes  Ears:    Normal TM's and external ear canals, both ears  Nose:   Nares normal, septum midline, mucosa normal, no drainage    or sinus tenderness  Throat:   Lips, mucosa, and tongue normal; teeth and gums normal  Neck:   Supple, symmetrical, trachea midline, no adenopathy;    thyroid:  no enlargement/tenderness/nodules; no carotid   bruit or JVD  Back:     Symmetric, no curvature, ROM normal, no CVA tenderness  Lungs:     Clear to auscultation bilaterally, respirations unlabored  Chest Wall:    No tenderness or deformity   Heart:    Regular rate and rhythm, S1 and S2 normal, no murmur, rub   or gallop  Breast Exam:    No tenderness, masses, or nipple abnormality  Abdomen:      Soft, non-tender, bowel sounds active all four quadrants,    no masses, no organomegaly  Genitalia:    Normal female without lesion, discharge or tenderness  Rectal:    Normal tone, normal prostate, no masses or tenderness;   guaiac negative stool  Extremities:   Extremities normal, atraumatic, no cyanosis or edema  Pulses:   2+ and symmetric all extremities  Skin:   Skin color, texture, turgor normal, no rashes or lesions  Lymph nodes:   Cervical, supraclavicular, and axillary nodes normal  Neurologic:   CNII-XII intact, normal strength, sensation and reflexes    throughout    Female Exam    Assessment:    irregular ovulation due to ovulation factor.   Plan:     will try a trial of clomid and timed intercourse     Face to face time:  20 minutes  Greater than 50% of the visit time was spent in counseling and coordination of care with the patient.  The summary and outline of the counseling and care coordination is summarized in the note above.   All questions were answered.

## 2016-12-13 ENCOUNTER — Telehealth: Payer: Self-pay | Admitting: Obstetrics & Gynecology

## 2016-12-13 ENCOUNTER — Encounter: Payer: Self-pay | Admitting: Obstetrics & Gynecology

## 2016-12-13 NOTE — Telephone Encounter (Signed)
[facility] to [facility] verbal order for cl[facility]id.

## 2017-01-02 ENCOUNTER — Encounter: Payer: Self-pay | Admitting: Obstetrics & Gynecology

## 2017-01-03 ENCOUNTER — Encounter: Payer: Self-pay | Admitting: Pediatrics

## 2017-01-06 ENCOUNTER — Other Ambulatory Visit: Payer: Self-pay | Admitting: Obstetrics and Gynecology

## 2017-01-06 DIAGNOSIS — O3680X0 Pregnancy with inconclusive fetal viability, not applicable or unspecified: Secondary | ICD-10-CM

## 2017-01-09 ENCOUNTER — Ambulatory Visit (INDEPENDENT_AMBULATORY_CARE_PROVIDER_SITE_OTHER): Payer: BLUE CROSS/BLUE SHIELD

## 2017-01-09 DIAGNOSIS — O3481 Maternal care for other abnormalities of pelvic organs, first trimester: Secondary | ICD-10-CM

## 2017-01-09 DIAGNOSIS — O3680X0 Pregnancy with inconclusive fetal viability, not applicable or unspecified: Secondary | ICD-10-CM | POA: Diagnosis not present

## 2017-01-09 DIAGNOSIS — Z3A01 Less than 8 weeks gestation of pregnancy: Secondary | ICD-10-CM

## 2017-01-09 NOTE — Progress Notes (Signed)
US 6+1 wks,single IUP w/ys,pos fht 99 bpm,normal ov's bilat,left corpus luteal cyst 2.3 x 1.6 x 2.1 cm,crl 4.7 mm,EDD 09/03/2017

## 2017-01-24 ENCOUNTER — Encounter: Payer: Self-pay | Admitting: Obstetrics & Gynecology

## 2017-01-24 ENCOUNTER — Ambulatory Visit: Payer: BLUE CROSS/BLUE SHIELD | Admitting: *Deleted

## 2017-01-24 ENCOUNTER — Encounter: Payer: BLUE CROSS/BLUE SHIELD | Admitting: Advanced Practice Midwife

## 2017-01-25 ENCOUNTER — Encounter: Payer: Self-pay | Admitting: Obstetrics & Gynecology

## 2017-01-26 ENCOUNTER — Encounter: Payer: BLUE CROSS/BLUE SHIELD | Admitting: Advanced Practice Midwife

## 2017-01-27 ENCOUNTER — Other Ambulatory Visit: Payer: Self-pay | Admitting: Advanced Practice Midwife

## 2017-01-27 MED ORDER — PROMETHAZINE HCL 12.5 MG PO TABS: 12.5000 mg | ORAL_TABLET | Freq: Four times a day (QID) | ORAL | 0 refills | Status: DC | PRN

## 2017-01-27 MED ORDER — DOXYLAMINE-PYRIDOXINE 10-10 MG PO TBEC: DELAYED_RELEASE_TABLET | ORAL | 3 refills | Status: DC

## 2017-01-30 ENCOUNTER — Telehealth: Payer: Self-pay | Admitting: *Deleted

## 2017-01-30 ENCOUNTER — Encounter: Payer: Self-pay | Admitting: *Deleted

## 2017-01-30 ENCOUNTER — Encounter: Payer: Self-pay | Admitting: Advanced Practice Midwife

## 2017-01-30 MED ORDER — PROMETHAZINE HCL 25 MG PO TABS: 12.5000 mg | ORAL_TABLET | Freq: Four times a day (QID) | ORAL | 0 refills | Status: DC | PRN

## 2017-01-30 MED ORDER — DOXYLAMINE-PYRIDOXINE 10-10 MG PO TBEC: DELAYED_RELEASE_TABLET | ORAL | 6 refills | Status: DC

## 2017-01-30 NOTE — Telephone Encounter (Signed)
Patient called stating she needs something for nausea. Haley Morales put in orders for Phenergan and Diclegis on Friday but was printed at hospital. Please resend. Thanks.

## 2017-02-07 ENCOUNTER — Encounter: Payer: Self-pay | Admitting: Advanced Practice Midwife

## 2017-02-08 ENCOUNTER — Ambulatory Visit (INDEPENDENT_AMBULATORY_CARE_PROVIDER_SITE_OTHER): Payer: BLUE CROSS/BLUE SHIELD | Admitting: Advanced Practice Midwife

## 2017-02-08 ENCOUNTER — Encounter: Payer: Self-pay | Admitting: Advanced Practice Midwife

## 2017-02-08 ENCOUNTER — Ambulatory Visit: Payer: BLUE CROSS/BLUE SHIELD | Admitting: *Deleted

## 2017-02-08 VITALS — Wt 102.0 lb

## 2017-02-08 DIAGNOSIS — Z1389 Encounter for screening for other disorder: Secondary | ICD-10-CM

## 2017-02-08 DIAGNOSIS — Z3400 Encounter for supervision of normal first pregnancy, unspecified trimester: Secondary | ICD-10-CM | POA: Insufficient documentation

## 2017-02-08 DIAGNOSIS — Z3401 Encounter for supervision of normal first pregnancy, first trimester: Secondary | ICD-10-CM | POA: Diagnosis not present

## 2017-02-08 DIAGNOSIS — Z331 Pregnant state, incidental: Secondary | ICD-10-CM

## 2017-02-08 DIAGNOSIS — Z3682 Encounter for antenatal screening for nuchal translucency: Secondary | ICD-10-CM

## 2017-02-08 MED ORDER — PRENATE MINI 18-0.6-0.4-350 MG PO CAPS: 1.0000 | ORAL_CAPSULE | Freq: Every day | ORAL | 11 refills | Status: DC

## 2017-02-08 MED ORDER — PANTOPRAZOLE SODIUM 20 MG PO TBEC: 20.0000 mg | DELAYED_RELEASE_TABLET | Freq: Every day | ORAL | 11 refills | Status: DC

## 2017-02-08 NOTE — Progress Notes (Signed)
  Subjective:    Haley Morales is a G1P0 5482w3d being seen today for her first obstetrical visit.  Her obstetrical history is significant for 1st pregnancy.  .She had been rx'd clomid, but got pregnnat before she even started taking it.  Pregnancy history fully reviewed.  Patient reports some mild nausea, mainly in am  Wants protonix refills.  .  Vitals:   02/08/17 1406  Weight: 102 lb (46.3 kg)    HISTORY: OB History  Gravida Para Term Preterm AB Living  1            SAB TAB Ectopic Multiple Live Births               # Outcome Date GA Lbr Len/2nd Weight Sex Delivery Anes PTL Lv  1 Current              Past Medical History:  Diagnosis Date  . Anxiety   . Depression    Past Surgical History:  Procedure Laterality Date  . NO PAST SURGERIES     Family History  Problem Relation Age of Onset  . Cancer Maternal Grandmother     lung cancer  . Cancer Maternal Grandfather     brain     Exam                                      System:     Skin: normal coloration and turgor, no rashes    Neurologic: oriented, normal, normal mood   Extremities: normal strength, tone, and muscle mass   HEENT PERRLA   Mouth/Teeth mucous membranes moist, normal dentition   Neck supple and no masses   Cardiovascular: regular rate and rhythm   Respiratory:  appears well, vitals normal, no respiratory distress, acyanotic   Abdomen: soft, non-tender;  FHR: 160 US          Assessment:    Pregnancy: G1P0 Patient Active Problem List   Diagnosis Date Noted  . Supervision of normal first pregnancy 02/08/2017  . GAD (generalized anxiety disorder) 08/13/2014        Plan:     Initial labs drawn. Continue prenatal vitamins  Problem list reviewed and updated  Reviewed n/v relief measures and warning s/s to report  Reviewed recommended weight gain based on pre-gravid BMI  Encouraged well-balanced diet Genetic Screening discussed Integrated Screen: declined.  Ultrasound  discussed; fetal survey: requested.  Return in about 4 weeks (around 03/08/2017) for LROB.  CRESENZO-DISHMAN,Haley Morales 02/08/2017

## 2017-02-08 NOTE — Patient Instructions (Signed)
 First Trimester of Pregnancy The first trimester of pregnancy is from week 1 until the end of week 12 (months 1 through 3). A week after a sperm fertilizes an egg, the egg will implant on the wall of the uterus. This embryo will begin to develop into a baby. Genes from you and your partner are forming the baby. The female genes determine whether the baby is a boy or a girl. At 6-8 weeks, the eyes and face are formed, and the heartbeat can be seen on ultrasound. At the end of 12 weeks, all the baby's organs are formed.  Now that you are pregnant, you will want to do everything you can to have a healthy baby. Two of the most important things are to get good prenatal care and to follow your health care provider's instructions. Prenatal care is all the medical care you receive before the baby's birth. This care will help prevent, find, and treat any problems during the pregnancy and childbirth. BODY CHANGES Your body goes through many changes during pregnancy. The changes vary from woman to woman.   You may gain or lose a couple of pounds at first.  You may feel sick to your stomach (nauseous) and throw up (vomit). If the vomiting is uncontrollable, call your health care provider.  You may tire easily.  You may develop headaches that can be relieved by medicines approved by your health care provider.  You may urinate more often. Painful urination may mean you have a bladder infection.  You may develop heartburn as a result of your pregnancy.  You may develop constipation because certain hormones are causing the muscles that push waste through your intestines to slow down.  You may develop hemorrhoids or swollen, bulging veins (varicose veins).  Your breasts may begin to grow larger and become tender. Your nipples may stick out more, and the tissue that surrounds them (areola) may become darker.  Your gums may bleed and may be sensitive to brushing and flossing.  Dark spots or blotches  (chloasma, mask of pregnancy) may develop on your face. This will likely fade after the baby is born.  Your menstrual periods will stop.  You may have a loss of appetite.  You may develop cravings for certain kinds of food.  You may have changes in your emotions from day to day, such as being excited to be pregnant or being concerned that something may go wrong with the pregnancy and baby.  You may have more vivid and strange dreams.  You may have changes in your hair. These can include thickening of your hair, rapid growth, and changes in texture. Some women also have hair loss during or after pregnancy, or hair that feels dry or thin. Your hair will most likely return to normal after your baby is born. WHAT TO EXPECT AT YOUR PRENATAL VISITS During a routine prenatal visit:  You will be weighed to make sure you and the baby are growing normally.  Your blood pressure will be taken.  Your abdomen will be measured to track your baby's growth.  The fetal heartbeat will be listened to starting around week 10 or 12 of your pregnancy.  Test results from any previous visits will be discussed. Your health care provider may ask you:  How you are feeling.  If you are feeling the baby move.  If you have had any abnormal symptoms, such as leaking fluid, bleeding, severe headaches, or abdominal cramping.  If you have any questions. Other   tests that may be performed during your first trimester include:  Blood tests to find your blood type and to check for the presence of any previous infections. They will also be used to check for low iron levels (anemia) and Rh antibodies. Later in the pregnancy, blood tests for diabetes will be done along with other tests if problems develop.  Urine tests to check for infections, diabetes, or protein in the urine.  An ultrasound to confirm the proper growth and development of the baby.  An amniocentesis to check for possible genetic problems.  Fetal  screens for spina bifida and Down syndrome.  You may need other tests to make sure you and the baby are doing well. HOME CARE INSTRUCTIONS  Medicines  Follow your health care provider's instructions regarding medicine use. Specific medicines may be either safe or unsafe to take during pregnancy.  Take your prenatal vitamins as directed.  If you develop constipation, try taking a stool softener if your health care provider approves. Diet  Eat regular, well-balanced meals. Choose a variety of foods, such as meat or vegetable-based protein, fish, milk and low-fat dairy products, vegetables, fruits, and whole grain breads and cereals. Your health care provider will help you determine the amount of weight gain that is right for you.  Avoid raw meat and uncooked cheese. These carry germs that can cause birth defects in the baby.  Eating four or five small meals rather than three large meals a day may help relieve nausea and vomiting. If you start to feel nauseous, eating a few soda crackers can be helpful. Drinking liquids between meals instead of during meals also seems to help nausea and vomiting.  If you develop constipation, eat more high-fiber foods, such as fresh vegetables or fruit and whole grains. Drink enough fluids to keep your urine clear or pale yellow. Activity and Exercise  Exercise only as directed by your health care provider. Exercising will help you:  Control your weight.  Stay in shape.  Be prepared for labor and delivery.  Experiencing pain or cramping in the lower abdomen or low back is a good sign that you should stop exercising. Check with your health care provider before continuing normal exercises.  Try to avoid standing for long periods of time. Move your legs often if you must stand in one place for a long time.  Avoid heavy lifting.  Wear low-heeled shoes, and practice good posture.  You may continue to have sex unless your health care provider directs you  otherwise. Relief of Pain or Discomfort  Wear a good support bra for breast tenderness.   Take warm sitz baths to soothe any pain or discomfort caused by hemorrhoids. Use hemorrhoid cream if your health care provider approves.   Rest with your legs elevated if you have leg cramps or low back pain.  If you develop varicose veins in your legs, wear support hose. Elevate your feet for 15 minutes, 3-4 times a day. Limit salt in your diet. Prenatal Care  Schedule your prenatal visits by the twelfth week of pregnancy. They are usually scheduled monthly at first, then more often in the last 2 months before delivery.  Write down your questions. Take them to your prenatal visits.  Keep all your prenatal visits as directed by your health care provider. Safety  Wear your seat belt at all times when driving.  Make a list of emergency phone numbers, including numbers for family, friends, the hospital, and police and fire departments. General   Tips  Ask your health care provider for a referral to a local prenatal education class. Begin classes no later than at the beginning of month 6 of your pregnancy.  Ask for help if you have counseling or nutritional needs during pregnancy. Your health care provider can offer advice or refer you to specialists for help with various needs.  Do not use hot tubs, steam rooms, or saunas.  Do not douche or use tampons or scented sanitary pads.  Do not cross your legs for long periods of time.  Avoid cat litter boxes and soil used by cats. These carry germs that can cause birth defects in the baby and possibly loss of the fetus by miscarriage or stillbirth.  Avoid all smoking, herbs, alcohol, and medicines not prescribed by your health care provider. Chemicals in these affect the formation and growth of the baby.  Schedule a dentist appointment. At home, brush your teeth with a soft toothbrush and be gentle when you floss. SEEK MEDICAL CARE IF:   You have  dizziness.  You have mild pelvic cramps, pelvic pressure, or nagging pain in the abdominal area.  You have persistent nausea, vomiting, or diarrhea.  You have a bad smelling vaginal discharge.  You have pain with urination.  You notice increased swelling in your face, hands, legs, or ankles. SEEK IMMEDIATE MEDICAL CARE IF:   You have a fever.  You are leaking fluid from your vagina.  You have spotting or bleeding from your vagina.  You have severe abdominal cramping or pain.  You have rapid weight gain or loss.  You vomit blood or material that looks like coffee grounds.  You are exposed to German measles and have never had them.  You are exposed to fifth disease or chickenpox.  You develop a severe headache.  You have shortness of breath.  You have any kind of trauma, such as from a fall or a car accident. Document Released: 10/25/2001 Document Revised: 03/17/2014 Document Reviewed: 09/10/2013 Exit[employer] Patient Information 2015 [employer]. This information is not intended to replace advice given to you by your health care provider. Make sure you discuss any questions you have with your health care provider.   Nausea & Vomiting  Have saltine crackers or pretzels by your bed and eat a few bites before you raise your head out of bed in the morning  Eat small frequent meals throughout the day instead of large meals  Drink plenty of fluids throughout the day to stay hydrated, just don't drink a lot of fluids with your meals.  This can make your stomach fill up faster making you feel sick  Do not brush your teeth right after you eat  Products with real ginger are good for nausea, like ginger ale and ginger hard candy Make sure it says made with real ginger!  Sucking on sour candy like lemon heads is also good for nausea  If your prenatal vitamins make you nauseated, take them at night so you will sleep through the nausea  Sea Bands  If you feel like you need  medicine for the nausea & vomiting please let us know  If you are unable to keep any fluids or food down please let us know   Constipation  Drink plenty of fluid, preferably water, throughout the day  Eat foods high in fiber such as fruits, vegetables, and grains  Exercise, such as walking, is a good way to keep your bowels regular  Drink warm fluids, especially warm   prune juice, or decaf coffee  Eat a 1/2 cup of real oatmeal (not instant), 1/2 cup applesauce, and 1/2-1 cup warm prune juice every day  If needed, you may take Colace (docusate sodium) stool softener once or twice a day to help keep the stool soft. If you are pregnant, wait until you are out of your first trimester (12-14 weeks of pregnancy)  If you still are having problems with constipation, you may take Miralax once daily as needed to help keep your bowels regular.  If you are pregnant, wait until you are out of your first trimester (12-14 weeks of pregnancy)  Safe Medications in Pregnancy   Acne: Benzoyl Peroxide Salicylic Acid  Backache/Headache: Tylenol: 2 regular strength every 4 hours OR              2 Extra strength every 6 hours  Colds/Coughs/Allergies: Benadryl (alcohol free) 25 mg every 6 hours as needed Breath right strips Claritin Cepacol throat lozenges Chloraseptic throat spray Cold-Eeze- up to three times per day Cough drops, alcohol free Flonase (by prescription only) Guaifenesin Mucinex Robitussin DM (plain only, alcohol free) Saline nasal spray/drops Sudafed (pseudoephedrine) & Actifed ** use only after [redacted] weeks gestation and if you do not have high blood pressure Tylenol Vicks Vaporub Zinc lozenges Zyrtec   Constipation: Colace Ducolax suppositories Fleet enema Glycerin suppositories Metamucil Milk of magnesia Miralax Senokot Smooth move tea  Diarrhea: Kaopectate Imodium A-D  *NO pepto Bismol  Hemorrhoids: Anusol Anusol HC Preparation  H Tucks  Indigestion: Tums Maalox Mylanta Zantac  Pepcid  Insomnia: Benadryl (alcohol free) 25mg every 6 hours as needed Tylenol PM Unisom, no Gelcaps  Leg Cramps: Tums MagGel  Nausea/Vomiting:  Bonine Dramamine Emetrol Ginger extract Sea bands Meclizine  Nausea medication to take during pregnancy:  Unisom (doxylamine succinate 25 mg tablets) Take one tablet daily at bedtime. If symptoms are not adequately controlled, the dose can be increased to a maximum recommended dose of two tablets daily (1/2 tablet in the morning, 1/2 tablet mid-afternoon and one at bedtime). Vitamin B6 100mg tablets. Take one tablet twice a day (up to 200 mg per day).  Skin Rashes: Aveeno products Benadryl cream or 25mg every 6 hours as needed Calamine Lotion 1% cortisone cream  Yeast infection: Gyne-lotrimin 7 Monistat 7   **If taking multiple medications, please check labels to avoid duplicating the same active ingredients **take medication as directed on the label ** Do not exceed 4000 mg of tylenol in 24 hours **Do not take medications that contain aspirin or ibuprofen      

## 2017-02-09 ENCOUNTER — Telehealth: Payer: Self-pay | Admitting: *Deleted

## 2017-02-09 ENCOUNTER — Other Ambulatory Visit: Payer: Self-pay | Admitting: Advanced Practice Midwife

## 2017-02-09 LAB — CBC
Hematocrit: 35 % (ref 34.0–46.6)
Hemoglobin: 11.7 g/dL (ref 11.1–15.9)
MCH: 29.3 pg (ref 26.6–33.0)
MCHC: 33.4 g/dL (ref 31.5–35.7)
MCV: 88 fL (ref 79–97)
Platelets: 238 10*3/uL (ref 150–379)
RBC: 3.99 x10E6/uL (ref 3.77–5.28)
RDW: 14.1 % (ref 12.3–15.4)
WBC: 12.7 10*3/uL — ABNORMAL HIGH (ref 3.4–10.8)

## 2017-02-09 LAB — HIV ANTIBODY (ROUTINE TESTING W REFLEX): HIV Screen 4th Generation wRfx: NONREACTIVE

## 2017-02-09 LAB — HEPATITIS B SURFACE ANTIGEN: Hepatitis B Surface Ag: NEGATIVE

## 2017-02-09 LAB — ANTIBODY SCREEN: Antibody Screen: NEGATIVE

## 2017-02-09 LAB — ABO/RH: Rh Factor: POSITIVE

## 2017-02-09 LAB — RUBELLA SCREEN: Rubella Antibodies, IGG: 1.95 index (ref >0.99)

## 2017-02-09 LAB — RPR: RPR Ser Ql: NONREACTIVE

## 2017-02-09 LAB — VARICELLA ZOSTER ANTIBODY, IGG: Varicella zoster IgG: 1038 index (ref >165)

## 2017-02-09 MED ORDER — PNV PRENATAL PLUS MULTIVITAMIN 27-1 MG PO TABS: 1.0000 | ORAL_TABLET | Freq: Every day | ORAL | 11 refills | Status: DC

## 2017-02-09 NOTE — Telephone Encounter (Signed)
I sent prenatal plus, or she can get OTC

## 2017-02-09 NOTE — Progress Notes (Signed)
prenatl plus.

## 2017-02-09 NOTE — Telephone Encounter (Signed)
PNV is on backorder, needs an alternative if possible, please advise.

## 2017-02-13 ENCOUNTER — Telehealth: Payer: Self-pay | Admitting: *Deleted

## 2017-02-13 NOTE — Telephone Encounter (Signed)
Pt's insurance has not approved Diclegis. Pt is taking Vit B6 and Unisom and that is working. JSY

## 2017-02-15 ENCOUNTER — Encounter: Payer: Self-pay | Admitting: Advanced Practice Midwife

## 2017-02-16 ENCOUNTER — Encounter: Payer: Self-pay | Admitting: *Deleted

## 2017-02-26 ENCOUNTER — Encounter: Payer: Self-pay | Admitting: Advanced Practice Midwife

## 2017-02-27 ENCOUNTER — Other Ambulatory Visit: Payer: Self-pay | Admitting: Women's Health

## 2017-02-27 MED ORDER — SILVER SULFADIAZINE 1 % EX CREA: 1.0000 "application " | TOPICAL_CREAM | Freq: Two times a day (BID) | CUTANEOUS | 0 refills | Status: DC

## 2017-02-28 ENCOUNTER — Other Ambulatory Visit: Payer: Self-pay | Admitting: Women's Health

## 2017-02-28 DIAGNOSIS — B001 Herpesviral vesicular dermatitis: Secondary | ICD-10-CM

## 2017-02-28 MED ORDER — VALACYCLOVIR HCL 1 G PO TABS: 2000.0000 mg | ORAL_TABLET | Freq: Two times a day (BID) | ORAL | 0 refills | Status: DC

## 2017-03-02 ENCOUNTER — Encounter: Payer: Self-pay | Admitting: Advanced Practice Midwife

## 2017-03-03 ENCOUNTER — Other Ambulatory Visit: Payer: Self-pay | Admitting: Women's Health

## 2017-03-03 ENCOUNTER — Encounter: Payer: Self-pay | Admitting: Obstetrics & Gynecology

## 2017-03-03 MED ORDER — VALACYCLOVIR HCL 1 G PO TABS: 2000.0000 mg | ORAL_TABLET | Freq: Two times a day (BID) | ORAL | 0 refills | Status: DC

## 2017-03-08 ENCOUNTER — Encounter: Payer: Self-pay | Admitting: Advanced Practice Midwife

## 2017-03-08 ENCOUNTER — Ambulatory Visit (INDEPENDENT_AMBULATORY_CARE_PROVIDER_SITE_OTHER): Payer: BLUE CROSS/BLUE SHIELD | Admitting: Advanced Practice Midwife

## 2017-03-08 VITALS — BP 112/66 | HR 104 | Wt 109.0 lb

## 2017-03-08 DIAGNOSIS — Z3401 Encounter for supervision of normal first pregnancy, first trimester: Secondary | ICD-10-CM | POA: Diagnosis not present

## 2017-03-08 DIAGNOSIS — Z1389 Encounter for screening for other disorder: Secondary | ICD-10-CM

## 2017-03-08 DIAGNOSIS — Z3402 Encounter for supervision of normal first pregnancy, second trimester: Secondary | ICD-10-CM

## 2017-03-08 DIAGNOSIS — Z363 Encounter for antenatal screening for malformations: Secondary | ICD-10-CM

## 2017-03-08 DIAGNOSIS — Z331 Pregnant state, incidental: Secondary | ICD-10-CM

## 2017-03-08 LAB — POCT URINALYSIS DIPSTICK
Blood, UA: NEGATIVE
Glucose, UA: NEGATIVE
Ketones, UA: NEGATIVE
Nitrite, UA: NEGATIVE
Protein, UA: NEGATIVE

## 2017-03-08 MED ORDER — VALACYCLOVIR HCL 500 MG PO TABS: 500.0000 mg | ORAL_TABLET | Freq: Two times a day (BID) | ORAL | 11 refills | Status: DC

## 2017-03-08 NOTE — Patient Instructions (Signed)

## 2017-03-08 NOTE — Progress Notes (Signed)
G1P0 [redacted]w[redacted]d Estimated Date of Delivery: 09/03/17  Blood pressure 112/66, pulse (!) 104, weight 109 lb (49.4 kg), last menstrual period 11/21/2016.   BP weight and urine results all reviewed and noted.  Please refer to the obstetrical flow sheet for the fundal height and fetal heart rate documentation:  Patient, denies any bleeding and no rupture of membranes symptoms or regular contractions. Patient is without complaints.felt like she was going to faint the other day, none since. All questions were answered.  Orders Placed This Encounter  Procedures  . US OB Comp + 14 Wk  . POCT urinalysis dipstick    Plan:  Continued routine obstetrical care,  Valtrex  BID for cold sore suppression per request Return in about 4 weeks (around 04/05/2017) for LROB, [id number].

## 2017-03-10 LAB — GC/CHLAMYDIA PROBE AMP
CHLAMYDIA, DNA PROBE: NEGATIVE
Neisseria gonorrhoeae by PCR: NEGATIVE

## 2017-03-12 LAB — URINE CULTURE

## 2017-03-16 ENCOUNTER — Encounter: Payer: Self-pay | Admitting: Advanced Practice Midwife

## 2017-03-16 ENCOUNTER — Other Ambulatory Visit: Payer: Self-pay | Admitting: Advanced Practice Midwife

## 2017-03-16 MED ORDER — AMPICILLIN 500 MG PO CAPS: 500.0000 mg | ORAL_CAPSULE | Freq: Three times a day (TID) | ORAL | 0 refills | Status: DC

## 2017-03-16 NOTE — Progress Notes (Signed)
Ampicillin for S Bovis in urine

## 2017-03-17 ENCOUNTER — Other Ambulatory Visit: Payer: Self-pay | Admitting: Women's Health

## 2017-03-17 ENCOUNTER — Encounter: Payer: Self-pay | Admitting: Advanced Practice Midwife

## 2017-03-17 DIAGNOSIS — R8271 Bacteriuria: Secondary | ICD-10-CM | POA: Insufficient documentation

## 2017-03-17 DIAGNOSIS — O9989 Other specified diseases and conditions complicating pregnancy, childbirth and the puerperium: Principal | ICD-10-CM

## 2017-03-17 DIAGNOSIS — O99891 Other specified diseases and conditions complicating pregnancy: Secondary | ICD-10-CM

## 2017-03-17 MED ORDER — CEPHALEXIN 500 MG PO CAPS: 500.0000 mg | ORAL_CAPSULE | Freq: Four times a day (QID) | ORAL | 0 refills | Status: DC

## 2017-03-17 NOTE — Progress Notes (Signed)
Pt emailed w/ reports of ampicillin 'not fitting well w/ stomach', making her ache really bad. 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 Will switch to keflex qid x 7d.  Cheral MarkerKimberly R. Booker, CNM, [facility]NP-BC 03/17/2017 11:39 AM

## 2017-03-19 ENCOUNTER — Encounter: Payer: Self-pay | Admitting: Advanced Practice Midwife

## 2017-03-30 ENCOUNTER — Encounter: Payer: Self-pay | Admitting: Advanced Practice Midwife

## 2017-03-31 ENCOUNTER — Telehealth: Payer: Self-pay | Admitting: *Deleted

## 2017-03-31 NOTE — Telephone Encounter (Signed)
Has been having increased BMs can try imodium,and if not better, let us know [place]

## 2017-03-31 NOTE — Telephone Encounter (Signed)
Patient states she spoke to patient.

## 2017-04-06 ENCOUNTER — Ambulatory Visit (INDEPENDENT_AMBULATORY_CARE_PROVIDER_SITE_OTHER): Payer: BLUE CROSS/BLUE SHIELD

## 2017-04-06 ENCOUNTER — Encounter: Payer: BLUE CROSS/BLUE SHIELD | Admitting: Obstetrics & Gynecology

## 2017-04-06 ENCOUNTER — Encounter: Payer: Self-pay | Admitting: Women's Health

## 2017-04-06 ENCOUNTER — Ambulatory Visit (INDEPENDENT_AMBULATORY_CARE_PROVIDER_SITE_OTHER): Payer: BLUE CROSS/BLUE SHIELD | Admitting: Women's Health

## 2017-04-06 VITALS — BP 112/68 | HR 104 | Wt 114.0 lb

## 2017-04-06 DIAGNOSIS — Z3402 Encounter for supervision of normal first pregnancy, second trimester: Secondary | ICD-10-CM

## 2017-04-06 DIAGNOSIS — F418 Other specified anxiety disorders: Secondary | ICD-10-CM

## 2017-04-06 DIAGNOSIS — Z363 Encounter for antenatal screening for malformations: Secondary | ICD-10-CM | POA: Diagnosis not present

## 2017-04-06 DIAGNOSIS — Z1389 Encounter for screening for other disorder: Secondary | ICD-10-CM

## 2017-04-06 DIAGNOSIS — Z331 Pregnant state, incidental: Secondary | ICD-10-CM

## 2017-04-06 DIAGNOSIS — O2342 Unspecified infection of urinary tract in pregnancy, second trimester: Secondary | ICD-10-CM | POA: Diagnosis not present

## 2017-04-06 LAB — POCT URINALYSIS DIPSTICK
Glucose, UA: NEGATIVE
Glucose, UA: NEGATIVE
Ketones, UA: NEGATIVE
Leukocytes, UA: NEGATIVE
Nitrite, UA: NEGATIVE
PROTEIN UA: NEGATIVE
Protein, UA: NEGATIVE
RBC UA: NEGATIVE

## 2017-04-06 MED ORDER — ESCITALOPRAM OXALATE 10 MG PO TABS: 10.0000 mg | ORAL_TABLET | Freq: Every day | ORAL | 6 refills | Status: DC

## 2017-04-06 MED ORDER — PROMETHAZINE HCL 25 MG PO TABS: 12.5000 mg | ORAL_TABLET | Freq: Four times a day (QID) | ORAL | 0 refills | Status: DC | PRN

## 2017-04-06 MED ORDER — OMEPRAZOLE 20 MG PO CPDR: 20.0000 mg | DELAYED_RELEASE_CAPSULE | Freq: Every day | ORAL | 3 refills | Status: DC

## 2017-04-06 NOTE — Addendum Note (Signed)
Addended by: Cheral MarkerBOOKER, Devere Brem R on: 04/06/2017 04:51 PM   Modules accepted: Orders

## 2017-04-06 NOTE — Progress Notes (Signed)
US 18+4 wks,breech,cx 4.5 cm,ant pl gr 0,normal ovaries bilat,svp of fluid 3.7 cm,fhr 144 bpm,efw 271 g,limited view of spine,please have pt come back for additional images after appt or f/u ultrasound.

## 2017-04-06 NOTE — Patient Instructions (Addendum)
Center Pediatricians/Family Doctors:  [facility] [phone]            [facility] [phone]                 [facility] [phone] (usually not accepting new patients unless you have family there already, you are always welcome to call and ask)            [place] Pediatricians/Family Doctors:   [facility]: [phone]  [facility]: [phone]   Second Trimester of Pregnancy The second trimester is from week 14 through week 27 (months 4 through 6). The second trimester is often a time when you feel your best. Your body has adjusted to being pregnant, and you begin to feel better physically. Usually, morning sickness has lessened or quit completely, you may have more energy, and you may have an increase in appetite. The second trimester is also a time when the fetus is growing rapidly. At the end of the sixth month, the fetus is about 9 inches long and weighs about 1 pounds. You will likely begin to feel the baby move (quickening) between 16 and 20 weeks of pregnancy. Body changes during your second trimester Your body continues to go through many changes during your second trimester. The changes vary from woman to woman.  Your weight will continue to increase. You will notice your lower abdomen bulging out.  You may begin to get stretch marks on your hips, abdomen, and breasts.  You may develop headaches that can be relieved by medicines. The medicines should be approved by your health care provider.  You may urinate more often because the fetus is pressing on your bladder.  You may develop or continue to have heartburn as a result of your pregnancy.  You may develop constipation because certain hormones are causing the muscles that push waste through your intestines to slow down.  You may develop hemorrhoids or swollen, bulging veins (varicose veins).  You may have back pain. This is caused by:  Weight  gain.  Pregnancy hormones that are relaxing the joints in your pelvis.  A shift in weight and the muscles that support your balance.  Your breasts will continue to grow and they will continue to become tender.  Your gums may bleed and may be sensitive to brushing and flossing.  Dark spots or blotches (chloasma, mask of pregnancy) may develop on your face. This will likely fade after the baby is born.  A dark line from your belly button to the pubic area (linea nigra) may appear. This will likely fade after the baby is born.  You may have changes in your hair. These can include thickening of your hair, rapid growth, and changes in texture. Some women also have hair loss during or after pregnancy, or hair that feels dry or thin. Your hair will most likely return to normal after your baby is born. What to expect at prenatal visits During a routine prenatal visit:  You will be weighed to make sure you and the fetus are growing normally.  Your blood pressure will be taken.  Your abdomen will be measured to track your baby's growth.  The fetal heartbeat will be listened to.  Any test results from the previous visit will be discussed. Your health care provider may ask you:  How you are feeling.  If you are feeling the baby move.  If you have had any abnormal symptoms, such as leaking fluid, bleeding, severe headaches, or abdominal   cramping.  If you are using any tobacco products, including cigarettes, chewing tobacco, and electronic cigarettes.  If you have any questions. Other tests that may be performed during your second trimester include:  Blood tests that check for:  Low iron levels (anemia).  High blood sugar that affects pregnant women (gestational diabetes) between 24 and 28 weeks.  Rh antibodies. This is to check for a protein on red blood cells (Rh factor).  Urine tests to check for infections, diabetes, or protein in the urine.  An ultrasound to confirm the  proper growth and development of the baby.  An amniocentesis to check for possible genetic problems.  Fetal screens for spina bifida and Down syndrome.  HIV (human immunodeficiency virus) testing. Routine prenatal testing includes screening for HIV, unless you choose not to have this test. Follow these instructions at home: Medicines   Follow your health care provider's instructions regarding medicine use. Specific medicines may be either safe or unsafe to take during pregnancy.  Take a prenatal vitamin that contains at least 600 micrograms (mcg) of folic acid.  If you develop constipation, try taking a stool softener if your health care provider approves. Eating and drinking   Eat a balanced diet that includes fresh fruits and vegetables, whole grains, good sources of protein such as meat, eggs, or tofu, and low-fat dairy. Your health care provider will help you determine the amount of weight gain that is right for you.  Avoid raw meat and uncooked cheese. These carry germs that can cause birth defects in the baby.  If you have low calcium intake from food, talk to your health care provider about whether you should take a daily calcium supplement.  Limit foods that are high in fat and processed sugars, such as fried and sweet foods.  To prevent constipation:  Drink enough fluid to keep your urine clear or pale yellow.  Eat foods that are high in fiber, such as fresh fruits and vegetables, whole grains, and beans. Activity   Exercise only as directed by your health care provider. Most women can continue their usual exercise routine during pregnancy. Try to exercise for 30 minutes at least 5 days a week. Stop exercising if you experience uterine contractions.  Avoid heavy lifting, wear low heel shoes, and practice good posture.  A sexual relationship may be continued unless your health care provider directs you otherwise. Relieving pain and discomfort   Wear a good support bra  to prevent discomfort from breast tenderness.  Take warm sitz baths to soothe any pain or discomfort caused by hemorrhoids. Use hemorrhoid cream if your health care provider approves.  Rest with your legs elevated if you have leg cramps or low back pain.  If you develop varicose veins, wear support hose. Elevate your feet for 15 minutes, 3-4 times a day. Limit salt in your diet. Prenatal Care   Write down your questions. Take them to your prenatal visits.  Keep all your prenatal visits as told by your health care provider. This is important. Safety   Wear your seat belt at all times when driving.  Make a list of emergency phone numbers, including numbers for family, friends, the hospital, and police and fire departments. General instructions   Ask your health care provider for a referral to a local prenatal education class. Begin classes no later than the beginning of month 6 of your pregnancy.  Ask for help if you have counseling or nutritional needs during pregnancy. Your health care provider   can offer advice or refer you to specialists for help with various needs.  Do not use hot tubs, steam rooms, or saunas.  Do not douche or use tampons or scented sanitary pads.  Do not cross your legs for long periods of time.  Avoid cat litter boxes and soil used by cats. These carry germs that can cause birth defects in the baby and possibly loss of the fetus by miscarriage or stillbirth.  Avoid all smoking, herbs, alcohol, and unprescribed drugs. Chemicals in these products can affect the formation and growth of the baby.  Do not use any products that contain nicotine or tobacco, such as cigarettes and e-cigarettes. If you need help quitting, ask your health care provider.  Visit your dentist if you have not gone yet during your pregnancy. Use a soft toothbrush to brush your teeth and be gentle when you floss. Contact a health care provider if:  You have dizziness.  You have mild  pelvic cramps, pelvic pressure, or nagging pain in the abdominal area.  You have persistent nausea, vomiting, or diarrhea.  You have a bad smelling vaginal discharge.  You have pain when you urinate. Get help right away if:  You have a fever.  You are leaking fluid from your vagina.  You have spotting or bleeding from your vagina.  You have severe abdominal cramping or pain.  You have rapid weight gain or weight loss.  You have shortness of breath with chest pain.  You notice sudden or extreme swelling of your face, hands, ankles, feet, or legs.  You have not felt your baby move in over an hour.  You have severe headaches that do not go away when you take medicine.  You have vision changes. Summary  The second trimester is from week 14 through week 27 (months 4 through 6). It is also a time when the fetus is growing rapidly.  Your body goes through many changes during pregnancy. The changes vary from woman to woman.  Avoid all smoking, herbs, alcohol, and unprescribed drugs. These chemicals affect the formation and growth your baby.  Do not use any tobacco products, such as cigarettes, chewing tobacco, and e-cigarettes. If you need help quitting, ask your health care provider.  Contact your health care provider if you have any questions. Keep all prenatal visits as told by your health care provider. This is important. This information is not intended to replace advice given to you by your health care provider. Make sure you discuss any questions you have with your health care provider. Document Released: 10/25/2001 Document Revised: 04/07/2016 Document Reviewed: 01/01/2013 Elsevier Interactive Patient Education  2017 [employer]  

## 2017-04-06 NOTE — Progress Notes (Addendum)
Low-risk OB appointment G1P0 7346w4d Estimated Date of Delivery: 09/03/17 BP 112/68   Pulse (!) 104   Wt 114 lb (51.7 kg)   LMP 11/21/2016 (Exact Date)   BMI 21.54 kg/m   BP, weight, and urine reviewed.  Refer to obstetrical flow sheet for FH & FHR.  Reports good fm.  Denies regular uc's, lof, vb, or uti s/s. Protonix not helping, wants to try prilosec- rx sent. Requests refill on phenergan- sent. Feels depressed/down all the time. Denies SI/HI/II. On Buspar and she feels it isn't helping as well as it used to- wants to add another medicine. Rx lexapro 10mg  daily- understands can take a few weeks to notice improvement. Declines counseling/therapy.  Reviewed today's anatomy u/s, all normal except limited views of spine- will try again for additional images prior to leaving today. Discussed warning s/s to report, fm.  Plan:  Continue routine obstetrical care   1651: still unable to obtain spine images d/t fetal position, f/u in 2wks for repeat u/s and visit

## 2017-04-08 LAB — URINE CULTURE

## 2017-04-13 ENCOUNTER — Encounter: Payer: Self-pay | Admitting: Women's Health

## 2017-04-19 ENCOUNTER — Ambulatory Visit (INDEPENDENT_AMBULATORY_CARE_PROVIDER_SITE_OTHER): Payer: BLUE CROSS/BLUE SHIELD

## 2017-04-19 ENCOUNTER — Ambulatory Visit (INDEPENDENT_AMBULATORY_CARE_PROVIDER_SITE_OTHER): Payer: BLUE CROSS/BLUE SHIELD | Admitting: Advanced Practice Midwife

## 2017-04-19 ENCOUNTER — Encounter: Payer: Self-pay | Admitting: Advanced Practice Midwife

## 2017-04-19 VITALS — BP 110/68 | HR 97 | Wt 116.0 lb

## 2017-04-19 DIAGNOSIS — Z331 Pregnant state, incidental: Secondary | ICD-10-CM

## 2017-04-19 DIAGNOSIS — Z3402 Encounter for supervision of normal first pregnancy, second trimester: Secondary | ICD-10-CM

## 2017-04-19 DIAGNOSIS — Z363 Encounter for antenatal screening for malformations: Secondary | ICD-10-CM

## 2017-04-19 DIAGNOSIS — Z1389 Encounter for screening for other disorder: Secondary | ICD-10-CM

## 2017-04-19 LAB — POCT URINALYSIS DIPSTICK
Blood, UA: NEGATIVE
Glucose, UA: NEGATIVE
KETONES UA: NEGATIVE
NITRITE UA: NEGATIVE
PROTEIN UA: NEGATIVE

## 2017-04-19 MED ORDER — PROMETHAZINE HCL 25 MG PO TABS: 12.5000 mg | ORAL_TABLET | Freq: Four times a day (QID) | ORAL | 2 refills | Status: DC | PRN

## 2017-04-19 NOTE — Patient Instructions (Signed)

## 2017-04-19 NOTE — Progress Notes (Signed)
OB Follow up US today @ 5249w3d GA. Single active fetus in cephalic position with FHR 154bpm. Fluid 3.2cm which is subjectively normal. Anterior Placenta. Bilateral adnexas WNL. EFW 373g which is consistent with LMP dating. EDD 09/03/17. Cervix is closed and 3.0cm. Spine visualized and appears WNL.

## 2017-04-19 NOTE — Progress Notes (Signed)
G1P0 6685w3d Estimated Date of Delivery: 09/03/17  Last menstrual period 11/21/2016.   BP weight and urine results all reviewed and noted. F/U US d/t not seeing spine:  All normal views today   Please refer to the obstetrical flow sheet for the fundal height and fetal heart rate documentation:  Patient reports good fetal movement, denies any bleeding and no rupture of membranes symptoms or regular contractions. Patient is without complaints. All questions were answered.  Orders Placed This Encounter  Procedures  . POCT urinalysis dipstick    Plan:  Continued routine obstetrical care,   Return in about 4 weeks (around 05/17/2017) for LROB.

## 2017-04-19 NOTE — Addendum Note (Signed)
Addended by: Jacklyn ShellRESENZO-DISHMON, Cheray Pardi on: 04/19/2017 03:31 PM   Modules accepted: Orders

## 2017-05-18 ENCOUNTER — Encounter: Payer: Self-pay | Admitting: Women's Health

## 2017-05-18 ENCOUNTER — Ambulatory Visit (INDEPENDENT_AMBULATORY_CARE_PROVIDER_SITE_OTHER): Payer: BLUE CROSS/BLUE SHIELD | Admitting: Women's Health

## 2017-05-18 VITALS — BP 90/50 | HR 82 | Wt 121.0 lb

## 2017-05-18 DIAGNOSIS — Z3A24 24 weeks gestation of pregnancy: Secondary | ICD-10-CM

## 2017-05-18 DIAGNOSIS — Z1389 Encounter for screening for other disorder: Secondary | ICD-10-CM

## 2017-05-18 DIAGNOSIS — Z331 Pregnant state, incidental: Secondary | ICD-10-CM

## 2017-05-18 DIAGNOSIS — Z3402 Encounter for supervision of normal first pregnancy, second trimester: Secondary | ICD-10-CM

## 2017-05-18 LAB — POCT URINALYSIS DIPSTICK
Blood, UA: NEGATIVE
GLUCOSE UA: NEGATIVE
Ketones, UA: NEGATIVE
LEUKOCYTES UA: NEGATIVE
Nitrite, UA: NEGATIVE

## 2017-05-18 NOTE — Patient Instructions (Signed)
You will have your sugar test next visit.  Please do not eat or drink anything after midnight the night before you come, not even water.  You will be here for at least two hours.     Call the office ([phone]) or go to [facility] if:  You begin to have strong, frequent contractions  Your water breaks.  Sometimes it is a big gush of fluid, sometimes it is just a trickle that keeps getting your panties wet or running down your legs  You have vaginal bleeding.  It is normal to have a small amount of spotting if your cervix was checked.   You don't feel your baby moving like normal.  If you don't, get you something to eat and drink and lay down and focus on feeling your baby move.   If your baby is still not moving like normal, you should call the office or go to [facility].  Second Trimester of Pregnancy The second trimester is from week 13 through week 28, months 4 through 6. The second trimester is often a time when you feel your best. Your body has also adjusted to being pregnant, and you begin to feel better physically. Usually, morning sickness has lessened or quit completely, you may have more energy, and you may have an increase in appetite. The second trimester is also a time when the fetus is growing rapidly. At the end of the sixth month, the fetus is about 9 inches long and weighs about 1 pounds. You will likely begin to feel the baby move (quickening) between 18 and 20 weeks of the pregnancy. BODY CHANGES Your body goes through many changes during pregnancy. The changes vary from woman to woman.   Your weight will continue to increase. You will notice your lower abdomen bulging out.  You may begin to get stretch marks on your hips, abdomen, and breasts.  You may develop headaches that can be relieved by medicines approved by your health care provider.  You may urinate more often because the fetus is pressing on your bladder.  You may develop or continue to have  heartburn as a result of your pregnancy.  You may develop constipation because certain hormones are causing the muscles that push waste through your intestines to slow down.  You may develop hemorrhoids or swollen, bulging veins (varicose veins).  You may have back pain because of the weight gain and pregnancy hormones relaxing your joints between the bones in your pelvis and as a result of a shift in weight and the muscles that support your balance.  Your breasts will continue to grow and be tender.  Your gums may bleed and may be sensitive to brushing and flossing.  Dark spots or blotches (chloasma, mask of pregnancy) may develop on your face. This will likely fade after the baby is born.  A dark line from your belly button to the pubic area (linea nigra) may appear. This will likely fade after the baby is born.  You may have changes in your hair. These can include thickening of your hair, rapid growth, and changes in texture. Some women also have hair loss during or after pregnancy, or hair that feels dry or thin. Your hair will most likely return to normal after your baby is born. WHAT TO EXPECT AT YOUR PRENATAL VISITS During a routine prenatal visit:  You will be weighed to make sure you and the fetus are growing normally.  Your blood pressure will be taken.    Your abdomen will be measured to track your baby's growth.  The fetal heartbeat will be listened to.  Any test results from the previous visit will be discussed. Your health care provider may ask you:  How you are feeling.  If you are feeling the baby move.  If you have had any abnormal symptoms, such as leaking fluid, bleeding, severe headaches, or abdominal cramping.  If you have any questions. Other tests that may be performed during your second trimester include:  Blood tests that check for:  Low iron levels (anemia).  Gestational diabetes (between 24 and 28 weeks).  Rh antibodies.  Urine tests to check  for infections, diabetes, or protein in the urine.  An ultrasound to confirm the proper growth and development of the baby.  An amniocentesis to check for possible genetic problems.  Fetal screens for spina bifida and Down syndrome. HOME CARE INSTRUCTIONS   Avoid all smoking, herbs, alcohol, and unprescribed drugs. These chemicals affect the formation and growth of the baby.  Follow your health care provider's instructions regarding medicine use. There are medicines that are either safe or unsafe to take during pregnancy.  Exercise only as directed by your health care provider. Experiencing uterine cramps is a good sign to stop exercising.  Continue to eat regular, healthy meals.  Wear a good support bra for breast tenderness.  Do not use hot tubs, steam rooms, or saunas.  Wear your seat belt at all times when driving.  Avoid raw meat, uncooked cheese, cat litter boxes, and soil used by cats. These carry germs that can cause birth defects in the baby.  Take your prenatal vitamins.  Try taking a stool softener (if your health care provider approves) if you develop constipation. Eat more high-fiber foods, such as fresh vegetables or fruit and whole grains. Drink plenty of fluids to keep your urine clear or pale yellow.  Take warm sitz baths to soothe any pain or discomfort caused by hemorrhoids. Use hemorrhoid cream if your health care provider approves.  If you develop varicose veins, wear support hose. Elevate your feet for 15 minutes, 3-4 times a day. Limit salt in your diet.  Avoid heavy lifting, wear low heel shoes, and practice good posture.  Rest with your legs elevated if you have leg cramps or low back pain.  Visit your dentist if you have not gone yet during your pregnancy. Use a soft toothbrush to brush your teeth and be gentle when you floss.  A sexual relationship may be continued unless your health care provider directs you otherwise.  Continue to go to all your  prenatal visits as directed by your health care provider. SEEK MEDICAL CARE IF:   You have dizziness.  You have mild pelvic cramps, pelvic pressure, or nagging pain in the abdominal area.  You have persistent nausea, vomiting, or diarrhea.  You have a bad smelling vaginal discharge.  You have pain with urination. SEEK IMMEDIATE MEDICAL CARE IF:   You have a fever.  You are leaking fluid from your vagina.  You have spotting or bleeding from your vagina.  You have severe abdominal cramping or pain.  You have rapid weight gain or loss.  You have shortness of breath with chest pain.  You notice sudden or extreme swelling of your face, hands, ankles, feet, or legs.  You have not felt your baby move in over an hour.  You have severe headaches that do not go away with medicine.  You have vision changes.   Document Released: 10/25/2001 Document Revised: 11/05/2013 Document Reviewed: 01/01/2013 [employer] Patient Information 2015 [employer]. This information is not intended to replace advice given to you by your health care provider. Make sure you discuss any questions you have with your health care provider.     

## 2017-05-18 NOTE — Progress Notes (Signed)
Low-risk OB appointment G1P0 9620w4d Estimated Date of Delivery: 09/03/17 BP (!) 90/50   Pulse 82   Wt 121 lb (54.9 kg)   LMP 11/21/2016 (Exact Date)   BMI 22.86 kg/m   BP, weight, and urine reviewed.  Refer to obstetrical flow sheet for FH & FHR.  Reports good fm.  Denies regular uc's, lof, vb, or uti s/s. No complaints. Reviewed ptl s/s, fm. Plan:  Continue routine obstetrical care  F/U in 4wks for OB appointment and pn2

## 2017-05-26 ENCOUNTER — Encounter: Payer: Self-pay | Admitting: Women's Health

## 2017-05-26 ENCOUNTER — Telehealth: Payer: Self-pay | Admitting: *Deleted

## 2017-05-26 DIAGNOSIS — R300 Dysuria: Secondary | ICD-10-CM | POA: Diagnosis not present

## 2017-05-26 MED ORDER — NITROFURANTOIN MONOHYD MACRO 100 MG PO CAPS: 100.0000 mg | ORAL_CAPSULE | Freq: Two times a day (BID) | ORAL | 0 refills | Status: DC

## 2017-05-26 NOTE — Telephone Encounter (Signed)
Pt sent message w/ report of dysuria, thinks she has UTI, was rx called in. Our office currently closed. Rx macrobid, needs to come leave urine specimen at lab for culture prior to starting antibiotics.  Cheral MarkerKimberly R. Booker, CNM, [facility]WHNP-BC 05/26/2017 2:34 PM

## 2017-05-26 NOTE — Addendum Note (Signed)
Addended by: Cheral MarkerBOOKER, Essica Kiker R on: 05/26/2017 02:34 PM   Modules accepted: Orders

## 2017-05-26 NOTE — Telephone Encounter (Signed)
Informed patient per Selena BattenKim she needs to come leave a urine specimen with lab. Antibiotic will be sent.Verbalized understanding.

## 2017-05-28 LAB — URINE CULTURE

## 2017-06-01 ENCOUNTER — Encounter: Payer: Self-pay | Admitting: Women's Health

## 2017-06-15 ENCOUNTER — Encounter: Payer: Self-pay | Admitting: Women's Health

## 2017-06-15 ENCOUNTER — Other Ambulatory Visit: Payer: BLUE CROSS/BLUE SHIELD

## 2017-06-15 ENCOUNTER — Ambulatory Visit (INDEPENDENT_AMBULATORY_CARE_PROVIDER_SITE_OTHER): Payer: BLUE CROSS/BLUE SHIELD | Admitting: Women's Health

## 2017-06-15 VITALS — BP 102/50 | HR 102 | Wt 128.5 lb

## 2017-06-15 DIAGNOSIS — Z3402 Encounter for supervision of normal first pregnancy, second trimester: Secondary | ICD-10-CM | POA: Diagnosis not present

## 2017-06-15 DIAGNOSIS — Z3403 Encounter for supervision of normal first pregnancy, third trimester: Secondary | ICD-10-CM

## 2017-06-15 DIAGNOSIS — Z131 Encounter for screening for diabetes mellitus: Secondary | ICD-10-CM | POA: Diagnosis not present

## 2017-06-15 DIAGNOSIS — Z3A28 28 weeks gestation of pregnancy: Secondary | ICD-10-CM

## 2017-06-15 DIAGNOSIS — Z1389 Encounter for screening for other disorder: Secondary | ICD-10-CM

## 2017-06-15 DIAGNOSIS — Z331 Pregnant state, incidental: Secondary | ICD-10-CM

## 2017-06-15 LAB — POCT URINALYSIS DIPSTICK
Blood, UA: NEGATIVE
Glucose, UA: NEGATIVE
KETONES UA: NEGATIVE
Nitrite, UA: NEGATIVE
PROTEIN UA: NEGATIVE

## 2017-06-15 NOTE — Progress Notes (Signed)
Low-risk OB appointment G1P0 6841w4d Estimated Date of Delivery: 09/03/17 BP (!) 102/50   Pulse (!) 102   Wt 128 lb 8 oz (58.3 kg)   LMP 11/21/2016 (Exact Date)   BMI 24.28 kg/m   BP, weight, and urine reviewed.  Refer to obstetrical flow sheet for FH & FHR.  Reports good fm.  Denies regular uc's, lof, vb, or uti s/s. No complaints. Reviewed ptl s/s, fkc. Recommended Tdap at HD/PCP per CDC guidelines.  Plan:  Continue routine obstetrical care  F/U in 4wks for OB appointment  PN2 today

## 2017-06-15 NOTE — Patient Instructions (Signed)

## 2017-06-16 LAB — CBC
Hematocrit: 33 % — ABNORMAL LOW (ref 34.0–46.6)
Hemoglobin: 11.4 g/dL (ref 11.1–15.9)
MCH: 30.9 pg (ref 26.6–33.0)
MCHC: 34.5 g/dL (ref 31.5–35.7)
MCV: 89 fL (ref 79–97)
Platelets: 216 10*3/uL (ref 150–379)
RBC: 3.69 x10E6/uL — ABNORMAL LOW (ref 3.77–5.28)
RDW: 13.9 % (ref 12.3–15.4)
WBC: 13.7 10*3/uL — ABNORMAL HIGH (ref 3.4–10.8)

## 2017-06-16 LAB — ANTIBODY SCREEN: Antibody Screen: NEGATIVE

## 2017-06-16 LAB — RPR: RPR Ser Ql: NONREACTIVE

## 2017-06-16 LAB — GLUCOSE TOLERANCE, 2 HOURS W/ 1HR
GLUCOSE, 1 HOUR: 160 mg/dL (ref 65–179)
GLUCOSE, FASTING: 75 mg/dL (ref 65–91)
Glucose, 2 hour: 109 mg/dL (ref 65–152)

## 2017-06-16 LAB — HIV ANTIBODY (ROUTINE TESTING W REFLEX): HIV SCREEN 4TH GENERATION: NONREACTIVE

## 2017-06-28 ENCOUNTER — Encounter: Payer: Self-pay | Admitting: Women's Health

## 2017-06-30 ENCOUNTER — Telehealth: Payer: Self-pay | Admitting: *Deleted

## 2017-06-30 NOTE — Telephone Encounter (Signed)
Pt called stating that she felt like she had a sinus infection. She states that she has had nasal stuffiness for a week, a headache, and just now started with a sore throat. She states she has tried benadryl and claritin but nothing has helped. I advised her to try saline nasal spray to help with the stuffiness and maybe some mucinex to help break up the mucus. I advised her to try a humidifier as well. I advised her if no relief she could call back on Monday and we would try to get her in with a provider, as we have no available appt today. Pt verbalized understanding.

## 2017-07-01 ENCOUNTER — Emergency Department (HOSPITAL_COMMUNITY)
Admission: EM | Admit: 2017-07-01 | Discharge: 2017-07-01 | Disposition: A | Discharge status: Home / Self Care | Payer: BLUE CROSS/BLUE SHIELD | Attending: Emergency Medicine | Admitting: Emergency Medicine

## 2017-07-01 ENCOUNTER — Encounter (HOSPITAL_COMMUNITY): Payer: Self-pay | Admitting: Emergency Medicine

## 2017-07-01 DIAGNOSIS — Z3A28 28 weeks gestation of pregnancy: Secondary | ICD-10-CM | POA: Diagnosis not present

## 2017-07-01 DIAGNOSIS — J0110 Acute frontal sinusitis, unspecified: Secondary | ICD-10-CM

## 2017-07-01 DIAGNOSIS — Z79899 Other long term (current) drug therapy: Secondary | ICD-10-CM | POA: Diagnosis not present

## 2017-07-01 DIAGNOSIS — O99519 Diseases of the respiratory system complicating pregnancy, unspecified trimester: Secondary | ICD-10-CM | POA: Insufficient documentation

## 2017-07-01 DIAGNOSIS — O99513 Diseases of the respiratory system complicating pregnancy, third trimester: Secondary | ICD-10-CM | POA: Diagnosis not present

## 2017-07-01 DIAGNOSIS — Z3A00 Weeks of gestation of pregnancy not specified: Secondary | ICD-10-CM | POA: Insufficient documentation

## 2017-07-01 MED ORDER — FLUTICASONE PROPIONATE 50 MCG/ACT NA SUSP: 1.0000 | Freq: Every day | NASAL | 0 refills | Status: DC

## 2017-07-01 MED ORDER — AMOXICILLIN 500 MG PO CAPS: 500.0000 mg | ORAL_CAPSULE | Freq: Three times a day (TID) | ORAL | 0 refills | Status: AC

## 2017-07-01 NOTE — ED Triage Notes (Signed)
Patient c/o nasal congestion with sinus pressure, headache, and yellow drainage from ears bilateral. Denies any fevers. Patient 7 months pregnant. Denies any cpmplications with pregnancy or any related complaints to pregnancy.

## 2017-07-01 NOTE — ED Provider Notes (Signed)
[facility]-EM[facility]Y DEPT Provider Note   CSN: [id number] Arrival date & time: 07/01/17  0941     History   Chief Complaint Chief Complaint  Patient presents with  . Nasal Congestion    HPI Haley Morales is a 26 y.o. female who is currently 7 months pregnant without complications presenting with a one week history of suspected sinus infection, including nasal and sinus congestion along with thick nasal congestion, post nasal drip and bilateral ear pressure along with drainage of more wax than normal from her ears.  She has been taken afrin daily for the past 5 days without improvement in symptoms. She endorses persistent headache, denies fevers, chills, neck pain or stiffness and no cough, sob or chest pain .  The history is provided by the patient and the spouse.    Past Medical History:  Diagnosis Date  . Anxiety   . Depression     Patient Active Problem List   Diagnosis Date Noted  . Asymptomatic bacteriuria during pregnancy in second trimester 03/17/2017  . Herpes labialis 02/28/2017  . Supervision of normal first pregnancy 02/08/2017  . Depression with anxiety 08/13/2014    Past Surgical History:  Procedure Laterality Date  . NO PAST SURGERIES      OB History    Gravida Para Term Preterm AB Living   1             SAB TAB Ectopic Multiple Live Births                   Home Medications    Prior to Admission medications   Medication Sig Start Date End Date Taking? Authorizing Provider  acetaminophen (TYLENOL) 500 MG tablet Take 500 mg by mouth every 6 (six) hours as needed.   Yes [provider]  busPIRone (BUSPAR) 15 MG tablet TAKE 1/2 TABLET BY MOUTH 2 TIMES DAILY. Patient taking differently: 1 tablet twice daily 08/04/14  Yes Flippo, Ree Kida, MD  omeprazole (PRILOSEC) 20 MG capsule Take 1 capsule (20 mg total) by mouth daily. Patient taking differently: Take 20 mg by mouth as needed.  04/06/17  Yes Cheral Marker, CNM  Prenatal Vit-Fe Fumarate-FA  (PNV PRENATAL PLUS MULTIVITAMIN) 27-1 MG TABS Take 1 tablet by mouth daily. 02/09/17  Yes Cresenzo-Dishmon, Scarlette Calico, CNM  promethazine (PHENERGAN) 25 MG tablet Take 0.5-1 tablets (12.5-25 mg total) by mouth every 6 (six) hours as needed for nausea or vomiting. 04/19/17  Yes Cresenzo-Dishmon, Scarlette Calico, CNM  valACYclovir (VALTREX) 500 MG tablet Take 1 tablet (500 mg total) by mouth 2 (two) times daily. Patient taking differently: Take 500 mg by mouth daily.  03/08/17  Yes Cresenzo-Dishmon, Scarlette Calico, CNM  amoxicillin (AMOXIL) 500 MG capsule Take 1 capsule (500 mg total) by mouth 3 (three) times daily. 07/01/17 07/11/17  Burgess Amor, PA-C  doxylamine, Sleep, (UNISOM) 25 MG tablet Take 25 mg by mouth at bedtime as needed for sleep.    [provider]  fluticasone (FLONASE) 50 MCG/ACT nasal spray Place 1 spray into both nostrils daily. 07/01/17   Burgess Amor, PA-C    Family History Family History  Problem Relation Age of Onset  . Cancer Maternal Grandmother        lung cancer  . Cancer Maternal Grandfather        brain    Social History Social History  Substance Use Topics  . Smoking status: Never Smoker  . Smokeless tobacco: Never Used  . Alcohol use No     Comment: occasional before pregnancy  Allergies   Paxil [paroxetine hcl]   Review of Systems Review of Systems  Constitutional: Negative for chills and fever.  HENT: Positive for congestion, ear discharge, postnasal drip, rhinorrhea, sinus pain and sinus pressure. Negative for ear pain, sore throat, trouble swallowing and voice change.   Eyes: Negative for discharge.  Respiratory: Negative for cough, shortness of breath, wheezing and stridor.   Cardiovascular: Negative for chest pain.  Gastrointestinal: Negative for abdominal pain, nausea and vomiting.  Genitourinary: Negative.      Physical Exam Updated Vital Signs BP 105/70   Pulse 81   Temp 98.1 F (36.7 C) (Oral)   Resp 16   Ht 5\' 2"  (1.575 m)   Wt 58.1 kg  (128 lb)   LMP 11/21/2016 (Exact Date)   SpO2 99%   BMI 23.41 kg/m   Physical Exam  Constitutional: She is oriented to person, place, and time. She appears well-developed and well-nourished.  HENT:  Head: Normocephalic and atraumatic.  Right Ear: Tympanic membrane and ear canal normal. No drainage.  Left Ear: Tympanic membrane and ear canal normal. No drainage.  Nose: Mucosal edema and rhinorrhea present. Right sinus exhibits frontal sinus tenderness. Left sinus exhibits frontal sinus tenderness.  Mouth/Throat: Uvula is midline, oropharynx is clear and moist and mucous membranes are normal. No oropharyngeal exudate, posterior oropharyngeal edema, posterior oropharyngeal erythema or tonsillar abscesses.  Eyes: Conjunctivae are normal.  Cardiovascular: Normal rate and normal heart sounds.   Pulmonary/Chest: Effort normal. No respiratory distress. She has no wheezes. She has no rales.  Abdominal: Soft. There is no tenderness.  Genitourinary:  Genitourinary Comments: Fetal heart tones 144  Musculoskeletal: Normal range of motion.  Neurological: She is alert and oriented to person, place, and time.  Skin: Skin is warm and dry. No rash noted.  Psychiatric: She has a normal mood and affect.     ED Treatments / Results  Labs (all labs ordered are listed, but only abnormal results are displayed) Labs Reviewed - No data to display  EKG  EKG Interpretation None       Radiology No results found.  Procedures Procedures (including critical care time)  Medications Ordered in ED Medications - No data to display   Initial Impression / Assessment and Plan / ED Course  I have reviewed the triage vital signs and the nursing notes.  Pertinent labs & imaging results that were available during my care of the patient were reviewed by me and considered in my medical decision making (see chart for details).     Advised dc afrin.  Flonase, amoxil, steam or vapor tx. Planned f/u with  ob/gyn this week.  Final Clinical Impressions(s) / ED Diagnoses   Final diagnoses:  Acute frontal sinusitis, recurrence not specified    New Prescriptions Discharge Medication List as of 07/01/2017 11:40 AM    START taking these medications   Details  amoxicillin (AMOXIL) 500 MG capsule Take 1 capsule (500 mg total) by mouth 3 (three) times daily., Starting Sat 07/01/2017, Until Tue 07/11/2017, Print    fluticasone (FLONASE) 50 MCG/ACT nasal spray Place 1 spray into both nostrils daily., Starting Sat 07/01/2017, Print         Victoriano Lain 07/02/17 2130    Donnetta Hutching, MD 07/05/17 [phone]

## 2017-07-13 ENCOUNTER — Ambulatory Visit (INDEPENDENT_AMBULATORY_CARE_PROVIDER_SITE_OTHER): Payer: BLUE CROSS/BLUE SHIELD | Admitting: Obstetrics & Gynecology

## 2017-07-13 ENCOUNTER — Encounter: Payer: Self-pay | Admitting: Obstetrics & Gynecology

## 2017-07-13 VITALS — BP 120/80 | HR 84 | Wt 138.0 lb

## 2017-07-13 DIAGNOSIS — Z3403 Encounter for supervision of normal first pregnancy, third trimester: Secondary | ICD-10-CM | POA: Diagnosis not present

## 2017-07-13 DIAGNOSIS — Z331 Pregnant state, incidental: Secondary | ICD-10-CM

## 2017-07-13 DIAGNOSIS — Z3A32 32 weeks gestation of pregnancy: Secondary | ICD-10-CM

## 2017-07-13 DIAGNOSIS — Z1389 Encounter for screening for other disorder: Secondary | ICD-10-CM

## 2017-07-13 LAB — POCT URINALYSIS DIPSTICK
Blood, UA: NEGATIVE
Glucose, UA: NEGATIVE
Nitrite, UA: NEGATIVE
Protein, UA: NEGATIVE

## 2017-07-13 MED ORDER — RANITIDINE HCL 150 MG PO CAPS: 150.0000 mg | ORAL_CAPSULE | Freq: Two times a day (BID) | ORAL | 1 refills | Status: DC

## 2017-07-13 NOTE — Progress Notes (Signed)
G1P0 2573w4d Estimated Date of Delivery: 09/03/17  Blood pressure 120/80, pulse 84, weight 138 lb (62.6 kg), last menstrual period 11/21/2016.   BP weight and urine results all reviewed and noted.  Please refer to the obstetrical flow sheet for the fundal height and fetal heart rate documentation:  Patient reports good fetal movement, denies any bleeding and no rupture of membranes symptoms or regular contractions. Patient is without complaints. All questions were answered.  Orders Placed This Encounter  Procedures  . POCT urinalysis dipstick    Plan:  Continued routine obstetrical care,  Meds ordered this encounter  Medications  . ranitidine (ZANTAC) 150 MG capsule    Sig: Take 1 capsule (150 mg total) by mouth 2 (two) times daily.    Dispense:  60 capsule    Refill:  1     Return in about 2 weeks (around 07/27/2017) for LROB.

## 2017-07-17 ENCOUNTER — Encounter: Payer: Self-pay | Admitting: Obstetrics & Gynecology

## 2017-07-20 ENCOUNTER — Encounter: Payer: Self-pay | Admitting: Obstetrics & Gynecology

## 2017-07-21 DIAGNOSIS — Z029 Encounter for administrative examinations, unspecified: Secondary | ICD-10-CM

## 2017-07-22 ENCOUNTER — Inpatient Hospital Stay (HOSPITAL_COMMUNITY)
Admission: AD | Admit: 2017-07-22 | Discharge: 2017-07-24 | DRG: 775 | Disposition: A | Discharge status: Home / Self Care | Payer: BLUE CROSS/BLUE SHIELD | Source: Ambulatory Visit | Attending: Obstetrics and Gynecology | Admitting: Obstetrics and Gynecology

## 2017-07-22 ENCOUNTER — Inpatient Hospital Stay (HOSPITAL_COMMUNITY): Payer: BLUE CROSS/BLUE SHIELD | Admitting: Anesthesiology

## 2017-07-22 ENCOUNTER — Encounter (HOSPITAL_COMMUNITY): Payer: Self-pay | Admitting: *Deleted

## 2017-07-22 DIAGNOSIS — O42013 Preterm premature rupture of membranes, onset of labor within 24 hours of rupture, third trimester: Secondary | ICD-10-CM | POA: Diagnosis not present

## 2017-07-22 DIAGNOSIS — Z3A33 33 weeks gestation of pregnancy: Secondary | ICD-10-CM

## 2017-07-22 DIAGNOSIS — O42919 Preterm premature rupture of membranes, unspecified as to length of time between rupture and onset of labor, unspecified trimester: Secondary | ICD-10-CM

## 2017-07-22 DIAGNOSIS — O42913 Preterm premature rupture of membranes, unspecified as to length of time between rupture and onset of labor, third trimester: Principal | ICD-10-CM | POA: Diagnosis present

## 2017-07-22 DIAGNOSIS — Z3403 Encounter for supervision of normal first pregnancy, third trimester: Secondary | ICD-10-CM

## 2017-07-22 LAB — TYPE AND SCREEN
ABO/RH(D): O POS
ANTIBODY SCREEN: NEGATIVE

## 2017-07-22 LAB — URINALYSIS, ROUTINE W REFLEX MICROSCOPIC
Bilirubin Urine: NEGATIVE
GLUCOSE, UA: NEGATIVE mg/dL
Ketones, ur: NEGATIVE mg/dL
NITRITE: NEGATIVE
PH: 7 (ref 5.0–8.0)
Protein, ur: 100 mg/dL — AB
SPECIFIC GRAVITY, URINE: 1.005 (ref 1.005–1.030)

## 2017-07-22 LAB — CBC
HCT: 33.7 % — ABNORMAL LOW (ref 36.0–46.0)
Hemoglobin: 11.5 g/dL — ABNORMAL LOW (ref 12.0–15.0)
MCH: 30.6 pg (ref 26.0–34.0)
MCHC: 34.1 g/dL (ref 30.0–36.0)
MCV: 89.6 fL (ref 78.0–100.0)
PLATELETS: 190 10*3/uL (ref 150–400)
RBC: 3.76 MIL/uL — ABNORMAL LOW (ref 3.87–5.11)
RDW: 13.9 % (ref 11.5–15.5)
WBC: 13.6 10*3/uL — ABNORMAL HIGH (ref 4.0–10.5)

## 2017-07-22 LAB — COMPREHENSIVE METABOLIC PANEL
ALBUMIN: 3.2 g/dL — AB (ref 3.5–5.0)
ALT: 21 U/L (ref 14–54)
AST: 35 U/L (ref 15–41)
Alkaline Phosphatase: 156 U/L — ABNORMAL HIGH (ref 38–126)
Anion gap: 12 (ref 5–15)
BUN: 7 mg/dL (ref 6–20)
CALCIUM: 9.3 mg/dL (ref 8.9–10.3)
CO2: 20 mmol/L — AB (ref 22–32)
Chloride: 104 mmol/L (ref 101–111)
Creatinine, Ser: 0.47 mg/dL (ref 0.44–1.00)
GFR calc non Af Amer: >60 mL/min (ref >60)
GLUCOSE: 80 mg/dL (ref 65–99)
POTASSIUM: 3.6 mmol/L (ref 3.5–5.1)
SODIUM: 136 mmol/L (ref 135–145)
Total Bilirubin: 0.5 mg/dL (ref 0.3–1.2)
Total Protein: 6.5 g/dL (ref 6.5–8.1)

## 2017-07-22 LAB — ABO/RH: ABO/RH(D): O POS

## 2017-07-22 LAB — PROTEIN / CREATININE RATIO, URINE
Creatinine, Urine: 33 mg/dL
PROTEIN CREATININE RATIO: 1.64 mg/mg{creat} — AB (ref 0.00–0.15)
TOTAL PROTEIN, URINE: 54 mg/dL

## 2017-07-22 MED ORDER — FENTANYL CITRATE (PF) 100 MCG/2ML IJ SOLN
100.0000 ug | INTRAMUSCULAR | Status: DC | PRN
  Administered 2017-07-22 (×3): 100 ug via INTRAVENOUS
  Filled 2017-07-22 (×2): qty 2

## 2017-07-22 MED ORDER — ZOLPIDEM TARTRATE 5 MG PO TABS: 5.0000 mg | ORAL_TABLET | Freq: Every evening | ORAL | Status: DC | PRN

## 2017-07-22 MED ORDER — BENZOCAINE-MENTHOL 20-0.5 % EX AERO
1.0000 "application " | INHALATION_SPRAY | CUTANEOUS | Status: DC | PRN
  Administered 2017-07-23: 1 via TOPICAL
  Filled 2017-07-22: qty 56

## 2017-07-22 MED ORDER — PRENATAL MULTIVITAMIN CH
1.0000 | ORAL_TABLET | Freq: Every day | ORAL | Status: DC
  Administered 2017-07-23: 1 via ORAL

## 2017-07-22 MED ORDER — LACTATED RINGERS IV SOLN: INTRAVENOUS | Status: DC

## 2017-07-22 MED ORDER — OXYCODONE-ACETAMINOPHEN 5-325 MG PO TABS: 1.0000 | ORAL_TABLET | ORAL | Status: DC | PRN

## 2017-07-22 MED ORDER — CALCIUM CARBONATE ANTACID 500 MG PO CHEW: 2.0000 | CHEWABLE_TABLET | ORAL | Status: DC | PRN

## 2017-07-22 MED ORDER — FENTANYL CITRATE (PF) 100 MCG/2ML IJ SOLN
INTRAMUSCULAR | Status: AC
  Filled 2017-07-22: qty 2

## 2017-07-22 MED ORDER — DIPHENHYDRAMINE HCL 50 MG/ML IJ SOLN: 12.5000 mg | INTRAMUSCULAR | Status: DC | PRN

## 2017-07-22 MED ORDER — BUSPIRONE HCL 15 MG PO TABS
15.0000 mg | ORAL_TABLET | Freq: Two times a day (BID) | ORAL | Status: DC
  Administered 2017-07-22: 15 mg via ORAL
  Filled 2017-07-22 (×3): qty 1

## 2017-07-22 MED ORDER — SODIUM BICARBONATE 8.4 % IV SOLN
INTRAVENOUS | Status: DC | PRN
  Administered 2017-07-22: 4 mL via EPIDURAL
  Administered 2017-07-22: 3 mL via EPIDURAL

## 2017-07-22 MED ORDER — PHENYLEPHRINE 40 MCG/ML (10ML) SYRINGE FOR IV PUSH (FOR BLOOD PRESSURE SUPPORT)
80.0000 ug | PREFILLED_SYRINGE | INTRAVENOUS | Status: DC | PRN
  Filled 2017-07-22: qty 5
  Filled 2017-07-22: qty 10

## 2017-07-22 MED ORDER — PHENYLEPHRINE 40 MCG/ML (10ML) SYRINGE FOR IV PUSH (FOR BLOOD PRESSURE SUPPORT)
80.0000 ug | PREFILLED_SYRINGE | INTRAVENOUS | Status: DC | PRN
  Filled 2017-07-22: qty 5

## 2017-07-22 MED ORDER — ACETAMINOPHEN 325 MG PO TABS: 650.0000 mg | ORAL_TABLET | ORAL | Status: DC | PRN

## 2017-07-22 MED ORDER — EPHEDRINE 5 MG/ML INJ
10.0000 mg | INTRAVENOUS | Status: DC | PRN
  Filled 2017-07-22: qty 2

## 2017-07-22 MED ORDER — DOCUSATE SODIUM 100 MG PO CAPS
100.0000 mg | ORAL_CAPSULE | Freq: Every day | ORAL | Status: DC
  Filled 2017-07-22: qty 1

## 2017-07-22 MED ORDER — PRENATAL MULTIVITAMIN CH
1.0000 | ORAL_TABLET | Freq: Every day | ORAL | Status: DC
  Filled 2017-07-22 (×2): qty 1

## 2017-07-22 MED ORDER — LIDOCAINE HCL (PF) 1 % IJ SOLN
30.0000 mL | INTRAMUSCULAR | Status: DC | PRN
  Filled 2017-07-22: qty 30

## 2017-07-22 MED ORDER — DIPHENHYDRAMINE HCL 25 MG PO CAPS
25.0000 mg | ORAL_CAPSULE | Freq: Four times a day (QID) | ORAL | Status: DC | PRN
  Administered 2017-07-22 – 2017-07-23 (×2): 25 mg via ORAL
  Filled 2017-07-22 (×2): qty 1

## 2017-07-22 MED ORDER — TETANUS-DIPHTH-ACELL PERTUSSIS 5-2.5-18.5 LF-MCG/0.5 IM SUSP
0.5000 mL | Freq: Once | INTRAMUSCULAR | Status: AC
Start: 2017-07-23 — End: 2017-07-23
  Administered 2017-07-23: 0.5 mL via INTRAMUSCULAR
  Filled 2017-07-22: qty 0.5

## 2017-07-22 MED ORDER — PENICILLIN G POTASSIUM 5000000 UNITS IJ SOLR
5.0000 10*6.[IU] | Freq: Once | INTRAVENOUS | Status: AC
  Administered 2017-07-22: 5 10*6.[IU] via INTRAVENOUS
  Filled 2017-07-22: qty 5

## 2017-07-22 MED ORDER — IBUPROFEN 600 MG PO TABS
600.0000 mg | ORAL_TABLET | Freq: Four times a day (QID) | ORAL | Status: DC
  Administered 2017-07-22 – 2017-07-24 (×7): 600 mg via ORAL
  Filled 2017-07-22 (×7): qty 1

## 2017-07-22 MED ORDER — SODIUM BICARBONATE 8.4 % IV SOLN: INTRAVENOUS | Status: DC | PRN

## 2017-07-22 MED ORDER — BETAMETHASONE SOD PHOS & ACET 6 (3-3) MG/ML IJ SUSP
12.0000 mg | INTRAMUSCULAR | Status: DC
  Administered 2017-07-22: 12 mg via INTRAMUSCULAR
  Filled 2017-07-22 (×2): qty 2

## 2017-07-22 MED ORDER — OXYTOCIN 40 UNITS IN LACTATED RINGERS INFUSION - SIMPLE MED
2.5000 [IU]/h | INTRAVENOUS | Status: DC
  Filled 2017-07-22: qty 1000

## 2017-07-22 MED ORDER — LACTATED RINGERS IV SOLN: 500.0000 mL | INTRAVENOUS | Status: DC | PRN

## 2017-07-22 MED ORDER — OXYTOCIN BOLUS FROM INFUSION
500.0000 mL | Freq: Once | INTRAVENOUS | Status: AC
  Administered 2017-07-22: 500 mL via INTRAVENOUS

## 2017-07-22 MED ORDER — COCONUT OIL OIL
1.0000 "application " | TOPICAL_OIL | Status: DC | PRN
  Administered 2017-07-23: 1 via TOPICAL
  Filled 2017-07-22: qty 120

## 2017-07-22 MED ORDER — LACTATED RINGERS IV SOLN
500.0000 mL | Freq: Once | INTRAVENOUS | Status: AC
  Administered 2017-07-22: 400 mL via INTRAVENOUS

## 2017-07-22 MED ORDER — PENICILLIN G POT IN DEXTROSE 60000 UNIT/ML IV SOLN
3.0000 10*6.[IU] | INTRAVENOUS | Status: DC
  Administered 2017-07-22 (×2): 3 10*6.[IU] via INTRAVENOUS
  Filled 2017-07-22 (×8): qty 50

## 2017-07-22 MED ORDER — SIMETHICONE 80 MG PO CHEW: 80.0000 mg | CHEWABLE_TABLET | ORAL | Status: DC | PRN

## 2017-07-22 MED ORDER — FENTANYL 2.5 MCG/ML BUPIVACAINE 1/10 % EPIDURAL INFUSION (WH - ANES)
14.0000 mL/h | INTRAMUSCULAR | Status: DC | PRN
  Administered 2017-07-22: 11 mL/h via EPIDURAL
  Filled 2017-07-22: qty 100

## 2017-07-22 MED ORDER — SENNOSIDES-DOCUSATE SODIUM 8.6-50 MG PO TABS
2.0000 | ORAL_TABLET | ORAL | Status: DC
  Administered 2017-07-22 – 2017-07-23 (×2): 2 via ORAL
  Filled 2017-07-22 (×2): qty 2

## 2017-07-22 MED ORDER — ONDANSETRON HCL 4 MG PO TABS: 4.0000 mg | ORAL_TABLET | ORAL | Status: DC | PRN

## 2017-07-22 MED ORDER — WITCH HAZEL-GLYCERIN EX PADS: 1.0000 "application " | MEDICATED_PAD | CUTANEOUS | Status: DC | PRN

## 2017-07-22 MED ORDER — DIBUCAINE 1 % RE OINT: 1.0000 "application " | TOPICAL_OINTMENT | RECTAL | Status: DC | PRN

## 2017-07-22 MED ORDER — ONDANSETRON HCL 4 MG/2ML IJ SOLN: 4.0000 mg | INTRAMUSCULAR | Status: DC | PRN

## 2017-07-22 MED ORDER — BUSPIRONE HCL 15 MG PO TABS
15.0000 mg | ORAL_TABLET | Freq: Two times a day (BID) | ORAL | Status: DC
  Administered 2017-07-22 – 2017-07-24 (×4): 15 mg via ORAL
  Filled 2017-07-22 (×4): qty 1

## 2017-07-22 MED ORDER — OXYCODONE-ACETAMINOPHEN 5-325 MG PO TABS: 2.0000 | ORAL_TABLET | ORAL | Status: DC | PRN

## 2017-07-22 MED ORDER — ONDANSETRON HCL 4 MG/2ML IJ SOLN
4.0000 mg | Freq: Four times a day (QID) | INTRAMUSCULAR | Status: DC | PRN
  Administered 2017-07-22: 4 mg via INTRAVENOUS

## 2017-07-22 MED ORDER — SOD CITRATE-CITRIC ACID 500-334 MG/5ML PO SOLN: 30.0000 mL | ORAL | Status: DC | PRN

## 2017-07-22 NOTE — Anesthesia Preprocedure Evaluation (Signed)
Anesthesia Evaluation  Patient identified by MRN, date of birth, ID band Patient awake    Airway Mallampati: I  TM Distance: >3 FB Neck ROM: Full    Dental no notable dental hx.    Pulmonary neg pulmonary ROS,    Pulmonary exam normal breath sounds clear to auscultation       Cardiovascular negative cardio ROS   Rhythm:Regular Rate:Normal     Neuro/Psych PSYCHIATRIC DISORDERS Anxiety negative neurological ROS     GI/Hepatic negative GI ROS, Neg liver ROS,   Endo/Other  negative endocrine ROS  Renal/GU   negative genitourinary   Musculoskeletal negative musculoskeletal ROS (+)   Abdominal   Peds negative pediatric ROS (+)  Hematology negative hematology ROS (+)   Anesthesia Other Findings   Reproductive/Obstetrics (+) Pregnancy                             Anesthesia Physical Anesthesia Plan  ASA: II  Anesthesia Plan: Epidural   Post-op Pain Management:    Induction:   PONV Risk Score and Plan:   Airway Management Planned:   Additional Equipment:   Intra-op Plan:   Post-operative Plan:   Informed Consent:   Plan Discussed with:   Anesthesia Plan Comments:         Anesthesia Quick Evaluation

## 2017-07-22 NOTE — Progress Notes (Signed)
Haley Morales is a 26 y.o. G1P0 at 6670w6d by first trimester U/S who presented for PPROM.    Subjective: Contraction pattern becoming more regular and patient with more pain.  She is requesting epidural.    Objective: BP 119/71   Pulse (!) 108   Temp 98.3 F (36.8 C) (Oral)   Resp 18   Ht 5\' 2"  (1.575 m)   Wt 62.6 kg (138 lb)   LMP 11/21/2016 (Exact Date)   BMI 25.24 kg/m  No intake/output data recorded. No intake/output data recorded.  FHT:  FHR: 120 bpm, variability: moderate,  accelerations:  Present,  decelerations:  Present early UC:   regular, every 2-3 minutes SVE:   Dilation: 3 Effacement (%): 100 Station: -2 Exam by:: [employer] Earl RNC  Labs: Lab Results  Component Value Date   WBC 13.6 (H) 07/22/2017   HGB 11.5 (L) 07/22/2017   HCT 33.7 (L) 07/22/2017   MCV 89.6 07/22/2017   PLT 190 07/22/2017    Assessment / Plan: G1P0 at 3070w6d admitted for PPROM and has progressed to active labor.  Labor: Progressing, monitor Preeclampsia:  N/A Fetal Wellbeing:  Category I Pain Control:  Epidural I/D:  GBS unknown - actively receiving PCN Anticipated MOD:  NSVD  Larene BeachMary K Travanti Mcmanus, DO PGY-2 07/22/2017, 3:17 PM

## 2017-07-22 NOTE — H&P (Signed)
Haley Morales is a 26 y.o. female presenting for rupture of membranes. She states that around 0100 she had 3 large gushes of clear fluid. She has continued leaking since then. She states that after ROM she started to have some mild cramping. She denies any VB. She reports normal fetal movement.  OB History    Gravida Para Term Preterm AB Living   1             SAB TAB Ectopic Multiple Live Births                 Past Medical History:  Diagnosis Date  . Anxiety   . Depression    Past Surgical History:  Procedure Laterality Date  . NO PAST SURGERIES     Family History: family history includes Cancer in her maternal grandfather and maternal grandmother. Social History:  reports that she has never smoked. She has never used smokeless tobacco. She reports that she does not drink alcohol or use drugs.     Maternal Diabetes: No Genetic Screening: Normal Maternal Ultrasounds/Referrals: Normal Fetal Ultrasounds or other Referrals:  None Maternal Substance Abuse:  No Significant Maternal Medications:  None Significant Maternal Lab Results:  None Other Comments:  None  Review of Systems  Constitutional: Negative for chills and fever.  Gastrointestinal: Negative for abdominal pain, nausea and vomiting.  Neurological: Negative for headaches.   Maternal Medical History:  Reason for admission: Rupture of membranes.  Nausea.  Contractions: Onset was 6-12 hours ago.   Frequency: regular.   Perceived severity is mild.    Fetal activity: Perceived fetal activity is normal.   Last perceived fetal movement was within the past hour.    Prenatal complications: no prenatal complications Prenatal Complications - Diabetes: none.      Last menstrual period 11/21/2016. Maternal Exam:  Uterine Assessment: Contraction strength is mild.  Contraction frequency is regular.   Abdomen: Patient reports no abdominal tenderness. Introitus: Ferning test: positive.  Nitrazine test: not  done. Amniotic fluid character: clear.  Cervix: not evaluated.   Fetal Exam Fetal Monitor Review: Mode: ultrasound.   Baseline rate: 145.  Variability: moderate (6-25 bpm).   Pattern: accelerations present and no decelerations.    Fetal State Assessment: Category I - tracings are normal.     Physical Exam  Nursing note and vitals reviewed. Constitutional: She is oriented to person, place, and time. She appears well-developed and well-nourished. No distress.  Cardiovascular: Normal rate.   Respiratory: Effort normal.  GI: Soft.  Neurological: She is alert and oriented to person, place, and time.  Skin: Skin is warm and dry.  Psychiatric: She has a normal mood and affect.    Prenatal labs: ABO, Rh: O/Positive/-- (03/28 1542) Antibody: Negative (08/02 0858) Rubella: 1.95 (03/28 1542) RPR: Non Reactive (08/02 0858)  HBsAg: Negative (03/28 1542)  HIV:    GBS:   UNKNOWN, Collected today   Assessment/Plan: PPROM @ 7078w6d  Admit to antenatal GBS prophylaxis BMZ    Thressa ShellerHeather Aneeka Bowden 07/22/2017, 6:08 AM

## 2017-07-22 NOTE — Anesthesia Pain Management Evaluation Note (Signed)
  [facility] Pain Management Visit Note  Patient: Haley Morales, 26 y.o., female  "Hello I am a [facility] of the anesthesia team at [facility]. We have an anesthesia team available at all times to provide care throughout the hospital, including epidural management and anesthesia for C-section. I don't know your plan for the delivery whether it a natural birth, water birth, IV sedation, nitrous supplementation, doula or epidural, but we want to meet your pain goals."   1.Was your pain managed to your expectations on prior hospitalizations?   No prior hospitalizations  2.What is your expectation for pain management during this hospitalization?     Epidural  3.How can we help you reach that goal? epidural  Record the patient's initial score and the patient's pain goal.   Pain: 8  Pain Goal: 4 [facility] wants you to be able to say your pain was always managed very well.  Haley Morales 07/22/2017

## 2017-07-22 NOTE — Lactation Note (Signed)
This note was copied from a baby's chart. Lactation Consultation Note  Patient Name: Haley Torrie MayersMalanie Morales Today's Date: 07/22/2017    Haley BattenKim, Haley Morales called to ask about Buspar compatibility with breast milk for baby--stating that mom on Buspar throughout pregnancy. Buspar an L3 (no data-probably compatible) medication per [facility]'s, "Medications and Mothers' Milk." Enc to have patient include medication when labeling EBM for NICU, and to always notify pediatrician of any medications mom is taking while providing milk for baby.    Maternal Data    Feeding    LATCH Score                   Interventions    Lactation Tools Discussed/Used     Consult Status      Haley Morales 07/22/2017, 7:00 PM

## 2017-07-22 NOTE — Anesthesia Procedure Notes (Signed)
Epidural Patient location during procedure: OB  Staffing Anesthesiologist: Odette FractionHARKINS, Helios Kohlmann Performed: anesthesiologist   Preanesthetic Checklist Completed: patient identified, site marked, surgical consent, pre-op evaluation, timeout performed, IV checked, risks and benefits discussed and monitors and equipment checked  Epidural Patient position: sitting Prep: ChloraPrep Patient monitoring: heart rate, continuous pulse ox and blood pressure Location: L3-L4 Injection technique: LOR saline and LOR air  Needle:  Needle type: Tuohy  Needle gauge: 18 G Needle length: 9 cm and 9 Needle insertion depth: 6 cm Catheter type: closed end flexible Catheter size: 20 Guage Catheter at skin depth: 11 cm Test dose: negative and 2% lidocaine with Epi 1:200 K  Assessment Events: blood not aspirated, injection not painful, no injection resistance, negative IV test and no paresthesia  Additional Notes Patient identified. Risks/Benefits/Options discussed with patient including but not limited to bleeding, infection, nerve damage, paralysis, failed block, incomplete pain control, headache, blood pressure changes, nausea, vomiting, reactions to medication both or allergic, itching and postpartum back pain. Confirmed with bedside nurse the patient's most recent platelet count. Confirmed with patient that they are not currently taking any anticoagulation, have any bleeding history or any family history of bleeding disorders. Patient expressed understanding and wished to proceed. All questions were answered. Sterile technique was used throughout the entire procedure. Please see nursing notes for vital signs. Test dose was given through epidural needle and negative prior to continuing to dose epidural or start infusion.   Patient was given instructions on fall risk and not to get out of bed. All questions and concerns addressed with instructions to call with any issues.

## 2017-07-23 LAB — RPR: RPR Ser Ql: NONREACTIVE

## 2017-07-23 MED ORDER — VALACYCLOVIR HCL 500 MG PO TABS
500.0000 mg | ORAL_TABLET | Freq: Two times a day (BID) | ORAL | Status: DC
  Administered 2017-07-23: 500 mg via ORAL
  Filled 2017-07-23 (×2): qty 1

## 2017-07-23 NOTE — Progress Notes (Signed)
Post Partum Day 1 Subjective: no complaints  Objective: Blood pressure 113/66, pulse 72, temperature 97.7 F (36.5 C), temperature source Oral, resp. rate 17, height 5\' 2"  (1.575 m), weight 62.6 kg (138 lb), last menstrual period 11/21/2016, SpO2 99 %.  Physical Exam:  General: alert, cooperative and no distress Lochia: appropriate Uterine Fundus: firm Incision: n/a DVT Evaluation: No evidence of DVT seen on physical exam.   Recent Labs  07/22/17 0630  HGB 11.5*  HCT 33.7*    Assessment/Plan: Breastfeeding(pump for baby in NICU) Routine PP care  LOS: 1 day   Scheryl DarterJames Arnold 07/23/2017, 7:47 AM

## 2017-07-23 NOTE — Anesthesia Postprocedure Evaluation (Signed)
Anesthesia Post Note  Patient: Torrie MayersMalanie Martinovich  Procedure(s) Performed: * No procedures listed *     Patient location during evaluation: Mother Baby Anesthesia Type: Epidural Level of consciousness: awake and alert Pain management: pain level controlled Vital Signs Assessment: post-procedure vital signs reviewed and stable Respiratory status: spontaneous breathing, nonlabored ventilation and respiratory function stable Cardiovascular status: stable Postop Assessment: no headache, no backache and epidural receding Anesthetic complications: no    Last Vitals:  Vitals:   07/22/17 2309 07/23/17 0745  BP: 113/66 (!) 94/43  Pulse: 72 96  Resp: 17 18  Temp: 36.5 C 36.9 C  SpO2: 99% 98%    Last Pain:  Vitals:   07/23/17 0755  TempSrc:   PainSc: 0-No pain   Pain Goal: Patients Stated Pain Goal: 2 (07/23/17 0700)               Adelfa Kohichard W Sheral FlowFlowers Jr

## 2017-07-24 ENCOUNTER — Encounter: Payer: Self-pay | Admitting: Women's Health

## 2017-07-24 LAB — CULTURE, BETA STREP (GROUP B ONLY)

## 2017-07-24 MED ORDER — IBUPROFEN 600 MG PO TABS: 600.0000 mg | ORAL_TABLET | Freq: Four times a day (QID) | ORAL | 0 refills | Status: DC

## 2017-07-24 NOTE — Discharge Instructions (Signed)

## 2017-07-24 NOTE — Progress Notes (Signed)
Discharge instructions given, questions answered, pt states understanding, signed and given copy 

## 2017-07-24 NOTE — Discharge Summary (Signed)
Obstetric Discharge Summary Reason for Admission: rupture of membranes Prenatal Procedures: none Intrapartum Procedures: spontaneous vaginal delivery Postpartum Procedures: none Complications-Operative and Postpartum: none Hemoglobin  Date Value Ref Range Status  07/22/2017 11.5 (L) 12.0 - 15.0 g/dL Final  91/47/829508/12/2016 62.111.4 11.1 - 15.9 g/dL Final   HCT  Date Value Ref Range Status  07/22/2017 33.7 (L) 36.0 - 46.0 % Final   Hematocrit  Date Value Ref Range Status  06/15/2017 33.0 (L) 34.0 - 46.6 % Final   Hospital Course: Pt admitted with PROM at 33 6/7 weeks. Has SVD without problems. See delivery note. Postpartum course was unremarkable. Progressed to ambulating, voiding, tolerating diet, breast pumping and oral pain control. Amendable for d/c home on PPD # 2.  Physical Exam:  General: alert Lochia: appropriate Uterine Fundus: firm Incision: healing well DVT Evaluation: No evidence of DVT seen on physical exam.  Discharge Diagnoses: SVD deliveried  Discharge Information: Date: 07/24/2017 Activity: pelvic rest Diet: routine Medications: PNV and Ibuprofen Condition: stable Instructions: refer to practice specific booklet Discharge to: home Follow-up Information    [facility]. Schedule an appointment as soon as possible for a visit in 4 week(s).   Specialty:  Obstetrics and Gynecology Why:  Postpartum visit  Contact information: [address] [place] [id number] [phone]          Newborn Data: Live born female  Birth Weight: 5 lb 8.9 oz (2520 g) APGAR: 7, 8  Stable in NICU.  Haley StaggersMichael L Hatley Morales 07/24/2017, 8:52 AM

## 2017-07-24 NOTE — Lactation Note (Signed)
This note was copied from a baby's chart. Lactation Consultation Note  Patient Name: [name] [id number]'s Date: 07/24/2017 Reason for consult: Follow-up assessment  With this first time mom and NICU baby, now 7942 hours old, 34 1/7 weeks CGA, and 5 lbs 6.8 oz. Mom is able to pump about 5-7 ml's of transitional milk. I reviewed hand expression, and engorgement to mom, and mom knows to have her baby's NICU nurse call for lactation as needed.    Maternal Data    Feeding Feeding Type: Donor Breast Milk Length of feed: 30 min  LATCH Score                   Interventions Interventions: Hand express;Skin to skin;Expressed milk;Coconut oil;Hand pump;DEBP;Ice  Lactation Tools Discussed/Used WIC Program: No Pump Review: Setup, frequency, and cleaning;Milk Storage;Other (comment) (hand expression, engorgemetn )   Consult Status Consult Status: PRN Follow-up type: In-patient (NICU)    Alfred LevinsLee, Anali Cabanilla Anne 07/24/2017, 9:58 AM

## 2017-07-28 ENCOUNTER — Encounter: Payer: BLUE CROSS/BLUE SHIELD | Admitting: Obstetrics & Gynecology

## 2017-08-01 ENCOUNTER — Encounter: Payer: Self-pay | Admitting: Advanced Practice Midwife

## 2017-08-01 ENCOUNTER — Encounter (HOSPITAL_COMMUNITY): Payer: Self-pay | Admitting: *Deleted

## 2017-08-04 ENCOUNTER — Ambulatory Visit: Payer: Self-pay

## 2017-08-04 NOTE — Lactation Note (Signed)
This note was copied from a baby's chart. Lactation Consultation Note  Patient Name: GirBennie Scaffrandt [id number] Date: 08/04/2017 Reason for consult: Follow-up assessment;NICU baby;Preterm <34wks   Visited with mom at infant's bedside. Infant to be d/c home today. Mom reports she has no questions/concerns at this time. Enc mom to call with any questions/concerns prn.    Maternal Data    Feeding Feeding Type: Breast Milk with Formula added Nipple Type: Slow - flow Length of feed: 20 min  LATCH Score                   Interventions    Lactation Tools Discussed/Used     Consult Status Consult Status: Complete Follow-up type: Call as needed    Ed Blalock 08/04/2017, 9:09 AM

## 2017-08-23 ENCOUNTER — Ambulatory Visit (INDEPENDENT_AMBULATORY_CARE_PROVIDER_SITE_OTHER): Payer: BLUE CROSS/BLUE SHIELD | Admitting: Advanced Practice Midwife

## 2017-08-23 ENCOUNTER — Encounter: Payer: Self-pay | Admitting: Advanced Practice Midwife

## 2017-08-23 NOTE — Progress Notes (Signed)
Haley Morales is a 27 y.o. who presents for a postpartum visit. She is 4 weeks postpartum following a spontaneous vaginal delivery. I have fully reviewed the prenatal and intrapartum course. The delivery was at 33.6 gestational weeks followng PPROM/active labor  Anesthesia: epidural. Postpartum course has been uneventful. Baby's course has been uneventful. Was in the NICU for 11 days.  Baby is feeding by bottle, pumped until a week ago. Bleeding: no bleeding. Bowel function is normal. Bladder function is normal. Patient is not sexually active. Contraception method is IUD.  Postpartum depression screening: negative.  Not on Lexapro any more, but feels like she's doing fine   Current Outpatient Prescriptions:  .  busPIRone (BUSPAR) 15 MG tablet, TAKE 1/2 TABLET BY MOUTH 2 TIMES DAILY. (Patient taking differently: 1 tablet twice daily), Disp: 30 tablet, Rfl: 2 .  ibuprofen (ADVIL,MOTRIN) 600 MG tablet, Take 1 tablet (600 mg total) by mouth every 6 (six) hours., Disp: 30 tablet, Rfl: 0 .  omeprazole (PRILOSEC) 20 MG capsule, Take 1 capsule (20 mg total) by mouth daily. (Patient not taking: Reported on 08/23/2017), Disp: 30 capsule, Rfl: 3 .  Prenatal Vit-Fe Fumarate-FA (PNV PRENATAL PLUS MULTIVITAMIN) 27-1 MG TABS, Take 1 tablet by mouth daily. (Patient not taking: Reported on 08/23/2017), Disp: 30 tablet, Rfl: 11 .  ranitidine (ZANTAC) 150 MG capsule, Take 1 capsule (150 mg total) by mouth 2 (two) times daily. (Patient not taking: Reported on 08/23/2017), Disp: 60 capsule, Rfl: 1  Review of Systems   Constitutional: Negative for fever and chills Eyes: Negative for visual disturbances Respiratory: Negative for shortness of breath, dyspnea Cardiovascular: Negative for chest pain or palpitations  Gastrointestinal: Negative for vomiting, diarrhea and constipation Genitourinary: Negative for dysuria and urgency Musculoskeletal: Negative for back pain, joint pain, myalgias  Neurological: Negative for  dizziness and headaches    Objective:     Vitals:   08/23/17 1057  BP: 120/80  Pulse: 100   General:  alert, cooperative and no distress   Breasts:  negative  Lungs: clear to auscultation bilaterally  Heart:  regular rate and rhythm  Abdomen: Soft, nontender   Vulva:  normal  Vagina: normal vagina  Cervix:  closed  Corpus: Well involuted     Rectal Exam: no hemorrhoids        Assessment:    normal postpartum exam.  Plan:   1. Contraception: IUD 2. Follow up in: 2 weeks for Mirena.  No sex until IUD

## 2017-09-01 ENCOUNTER — Encounter: Payer: Self-pay | Admitting: Advanced Practice Midwife

## 2017-09-04 ENCOUNTER — Encounter: Payer: Self-pay | Admitting: Women's Health

## 2017-09-04 ENCOUNTER — Ambulatory Visit (INDEPENDENT_AMBULATORY_CARE_PROVIDER_SITE_OTHER): Payer: BLUE CROSS/BLUE SHIELD | Admitting: Women's Health

## 2017-09-04 VITALS — BP 120/66 | HR 74 | Ht 62.0 in | Wt 120.8 lb

## 2017-09-04 DIAGNOSIS — Z3202 Encounter for pregnancy test, result negative: Secondary | ICD-10-CM | POA: Diagnosis not present

## 2017-09-04 DIAGNOSIS — Z30430 Encounter for insertion of intrauterine contraceptive device: Secondary | ICD-10-CM | POA: Diagnosis not present

## 2017-09-04 DIAGNOSIS — Z8751 Personal history of pre-term labor: Secondary | ICD-10-CM

## 2017-09-04 LAB — POCT URINE PREGNANCY: PREG TEST UR: NEGATIVE

## 2017-09-04 MED ORDER — LEVONORGESTREL 20 MCG/24HR IU IUD
INTRAUTERINE_SYSTEM | Freq: Once | INTRAUTERINE | Status: AC
  Administered 2017-09-04: 14:00:00 via INTRAUTERINE

## 2017-09-04 NOTE — Patient Instructions (Signed)
 Nothing in vagina for 3 days (no sex, douching, tampons, etc...)  Check your strings once a month to make sure you can feel them, if you are not able to please let us know  If you develop a fever of 100.4 or more in the next few weeks, or if you develop severe abdominal pain, please let us know  Use a backup method of birth control, such as condoms, for 2 weeks    Intrauterine Device Insertion, Care After This sheet gives you information about how to care for yourself after your procedure. Your health care provider may also give you more specific instructions. If you have problems or questions, contact your health care provider. What can I expect after the procedure? After the procedure, it is common to have:  Cramps and pain in the abdomen.  Light bleeding (spotting) or heavier bleeding that is like your menstrual period. This may last for up to a few days.  Lower back pain.  Dizziness.  Headaches.  Nausea.  Follow these instructions at home:  Before resuming sexual activity, check to make sure that you can feel the IUD string(s). You should be able to feel the end of the string(s) below the opening of your cervix. If your IUD string is in place, you may resume sexual activity. ? If you had a hormonal IUD inserted more than 7 days after your most recent period started, you will need to use a backup method of birth control for 7 days after IUD insertion. Ask your health care provider whether this applies to you.  Continue to check that the IUD is still in place by feeling for the string(s) after every menstrual period, or once a month.  Take over-the-counter and prescription medicines only as told by your health care provider.  Do not drive or use heavy machinery while taking prescription pain medicine.  Keep all follow-up visits as told by your health care provider. This is important. Contact a health care provider if:  You have bleeding that is heavier or lasts longer than  a normal menstrual cycle.  You have a fever.  You have cramps or abdominal pain that get worse or do not get better with medicine.  You develop abdominal pain that is new or is not in the same area of earlier cramping and pain.  You feel lightheaded or weak.  You have abnormal or bad-smelling discharge from your vagina.  You have pain during sexual activity.  You have any of the following problems with your IUD string(s): ? The string bothers or hurts you or your sexual partner. ? You cannot feel the string. ? The string has gotten longer.  You can feel the IUD in your vagina.  You think you may be pregnant, or you miss your menstrual period.  You think you may have an STI (sexually transmitted infection). Get help right away if:  You have flu-like symptoms.  You have a fever and chills.  You can feel that your IUD has slipped out of place. Summary  After the procedure, it is common to have cramps and pain in the abdomen. It is also common to have light bleeding (spotting) or heavier bleeding that is like your menstrual period.  Continue to check that the IUD is still in place by feeling for the string(s) after every menstrual period, or once a month.  Keep all follow-up visits as told by your health care provider. This is important.  Contact your health care provider if   you have problems with your IUD string(s), such as the string getting longer or bothering you or your sexual partner. This information is not intended to replace advice given to you by your health care provider. Make sure you discuss any questions you have with your health care provider. Document Released: 06/29/2011 Document Revised: 09/21/2016 Document Reviewed: 09/21/2016 Elsevier Interactive Patient Education  2017 Elsevier Inc.  Levonorgestrel intrauterine device (IUD) What is this medicine? LEVONORGESTREL IUD (LEE voe nor jes trel) is a contraceptive (birth control) device. The device is placed  inside the uterus by a healthcare professional. It is used to prevent pregnancy. This device can also be used to treat heavy bleeding that occurs during your period. This medicine may be used for other purposes; ask your health care provider or pharmacist if you have questions. COMMON BRAND NAME(S): Kyleena, LILETTA, Mirena, Skyla What should I tell my health care provider before I take this medicine? They need to know if you have any of these conditions: -abnormal Pap smear -cancer of the breast, uterus, or cervix -diabetes -endometritis -genital or pelvic infection now or in the past -have more than one sexual partner or your partner has more than one partner -heart disease -history of an ectopic or tubal pregnancy -immune system problems -IUD in place -liver disease or tumor -problems with blood clots or take blood-thinners -seizures -use intravenous drugs -uterus of unusual shape -vaginal bleeding that has not been explained -an unusual or allergic reaction to levonorgestrel, other hormones, silicone, or polyethylene, medicines, foods, dyes, or preservatives -pregnant or trying to get pregnant -breast-feeding How should I use this medicine? This device is placed inside the uterus by a health care professional. Talk to your pediatrician regarding the use of this medicine in children. Special care may be needed. Overdosage: If you think you have taken too much of this medicine contact a poison control center or emergency room at once. NOTE: This medicine is only for you. Do not share this medicine with others. What if I miss a dose? This does not apply. Depending on the brand of device you have inserted, the device will need to be replaced every 3 to 5 years if you wish to continue using this type of birth control. What may interact with this medicine? Do not take this medicine with any of the following medications: -amprenavir -bosentan -fosamprenavir This medicine may also  interact with the following medications: -aprepitant -armodafinil -barbiturate medicines for inducing sleep or treating seizures -bexarotene -boceprevir -griseofulvin -medicines to treat seizures like carbamazepine, ethotoin, felbamate, oxcarbazepine, phenytoin, topiramate -modafinil -pioglitazone -rifabutin -rifampin -rifapentine -some medicines to treat HIV infection like atazanavir, efavirenz, indinavir, lopinavir, nelfinavir, tipranavir, ritonavir -St. John's wort -warfarin This list may not describe all possible interactions. Give your health care provider a list of all the medicines, herbs, non-prescription drugs, or dietary supplements you use. Also tell them if you smoke, drink alcohol, or use illegal drugs. Some items may interact with your medicine. What should I watch for while using this medicine? Visit your doctor or health care professional for regular check ups. See your doctor if you or your partner has sexual contact with others, becomes HIV positive, or gets a sexual transmitted disease. This product does not protect you against HIV infection (AIDS) or other sexually transmitted diseases. You can check the placement of the IUD yourself by reaching up to the top of your vagina with clean fingers to feel the threads. Do not pull on the threads. It is a good habit   to check placement after each menstrual period. Call your doctor right away if you feel more of the IUD than just the threads or if you cannot feel the threads at all. The IUD may come out by itself. You may become pregnant if the device comes out. If you notice that the IUD has come out use a backup birth control method like condoms and call your health care provider. Using tampons will not change the position of the IUD and are okay to use during your period. This IUD can be safely scanned with magnetic resonance imaging (MRI) only under specific conditions. Before you have an MRI, tell your healthcare provider that  you have an IUD in place, and which type of IUD you have in place. What side effects may I notice from receiving this medicine? Side effects that you should report to your doctor or health care professional as soon as possible: -allergic reactions like skin rash, itching or hives, swelling of the face, lips, or tongue -fever, flu-like symptoms -genital sores -high blood pressure -no menstrual period for 6 weeks during use -pain, swelling, warmth in the leg -pelvic pain or tenderness -severe or sudden headache -signs of pregnancy -stomach cramping -sudden shortness of breath -trouble with balance, talking, or walking -unusual vaginal bleeding, discharge -yellowing of the eyes or skin Side effects that usually do not require medical attention (report to your doctor or health care professional if they continue or are bothersome): -acne -breast pain -change in sex drive or performance -changes in weight -cramping, dizziness, or faintness while the device is being inserted -headache -irregular menstrual bleeding within first 3 to 6 months of use -nausea This list may not describe all possible side effects. Call your doctor for medical advice about side effects. You may report side effects to FDA at 1-800-FDA-1088. Where should I keep my medicine? This does not apply. NOTE: This sheet is a summary. It may not cover all possible information. If you have questions about this medicine, talk to your doctor, pharmacist, or health care provider.  2018 Elsevier/Gold Standard (2016-08-12 14:14:56)   

## 2017-09-04 NOTE — Progress Notes (Signed)
   IUD INSERTION Patient name: Haley Morales MRN [id number]  Date of birth: 1991/09/19 Subjective Findings:   Haley Morales is a 26 y.o. [id number] Caucasian female being seen today for insertion of a Mirena IUD. She is about 6wks s/p SVB.   Patient's last menstrual period was 09/04/2017. Last sexual intercourse was 4d ago, however she is on her period now Last pap 2016 @ [facility]. Results were:  normal  The risks and benefits of the method and placement have been thouroughly reviewed with the patient and all questions were answered.  Specifically the patient is aware of failure rate of 11/998, expulsion of the IUD and of possible perforation.  The patient is aware of irregular bleeding due to the method and understands the incidence of irregular bleeding diminishes with time.  Signed copy of informed consent in chart.  Pertinent History Reviewed:   Reviewed past medical,surgical, social, obstetrical and family history.  Reviewed problem list, medications and allergies. Objective Findings & Procedure:   Vitals:   09/04/17 1136  BP: 120/66  Pulse: 74  Weight: 120 lb 12.8 oz (54.8 kg)  Height: 5\' 2"  (1.575 m)  Body mass index is 22.09 kg/m.  Results for orders placed or performed in visit on 09/04/17 (from the past 24 hour(s))  POCT urine pregnancy   Collection Time: 09/04/17 11:38 AM  Result Value Ref Range   Preg Test, Ur Negative Negative     Time out was performed.  A graves speculum was placed in the vagina.  The cervix was visualized, prepped using Betadine, and grasped with a single tooth tenaculum. The uterus was found to be retroflexed and it sounded to 7 cm.  Mirena IUD placed per manufacturer's recommendations. The strings were trimmed to approximately 3 cm. The patient tolerated the procedure well.   Informal transvaginal sonogram was performed and the proper placement of the IUD was verified. Assessment & Plan:   1) Mirena IUD insertion The patient was given  post procedure instructions, including signs and symptoms of infection and to check for the strings after each menses or each month, and refraining from intercourse or anything in the vagina for 3 days. She was given a Mirena care card with date IUD placed, and date IUD to be removed. She is scheduled for a f/u appointment in 4 weeks.  Orders Placed This Encounter  Procedures  . POCT urine pregnancy    Return in about 4 weeks (around 10/02/2017) for F/U.  Marge DuncansBooker, Nilo Fallin Randall CNM, [facility] 09/04/2017 12:18 PM

## 2017-09-04 NOTE — Addendum Note (Signed)
Addended by: Colen DarlingYOUNG, Angele Wiemann S on: 09/04/2017 02:15 PM   Modules accepted: Orders

## 2017-10-02 ENCOUNTER — Encounter: Payer: Self-pay | Admitting: Women's Health

## 2017-10-02 ENCOUNTER — Ambulatory Visit (INDEPENDENT_AMBULATORY_CARE_PROVIDER_SITE_OTHER): Payer: BLUE CROSS/BLUE SHIELD | Admitting: Women's Health

## 2017-10-02 ENCOUNTER — Other Ambulatory Visit: Payer: Self-pay

## 2017-10-02 VITALS — BP 108/68 | HR 97 | Ht 61.0 in | Wt 121.0 lb

## 2017-10-02 DIAGNOSIS — Z30431 Encounter for routine checking of intrauterine contraceptive device: Secondary | ICD-10-CM

## 2017-10-02 NOTE — Progress Notes (Signed)
   GYN VISIT Patient name: Haley Morales MRN [id number]  Date of birth: 03/26/1991 Chief Complaint:   Follow-up (IUD check)  History of Present Illness:   Haley Morales is a 26 y.o. [id number] Caucasian female being seen today for IUD check. She had a Mirena IUD placed 09/04/17. Daily bleeding since, mostly light, sometimes heavier and has to wear tampon. Occ mild cramps. No pain, no pain w/ sex. Able to feel strings when checking for them.      Patient's last menstrual period was 09/04/2017. The current method of family planning is IUD. Last pap 2016 @ [facility]. Results were:  normal Review of Systems:   Pertinent items are noted in HPI Denies fever/chills, dizziness, headaches, visual disturbances, fatigue, shortness of breath, chest pain, abdominal pain, vomiting, abnormal vaginal discharge/itching/odor/irritation, problems with periods, bowel movements, urination, or intercourse unless otherwise stated above.  Pertinent History Reviewed:  Reviewed past medical,surgical, social, obstetrical and family history.  Reviewed problem list, medications and allergies. Physical Assessment:   Vitals:   10/02/17 1557  BP: 108/68  Pulse: 97  Weight: 121 lb (54.9 kg)  Height: 5\' 1"  (1.549 m)  Body mass index is 22.86 kg/m.       Physical Examination:   General appearance: alert, well appearing, and in no distress  Mental status: alert, oriented to person, place, and time  Skin: warm & dry   Cardiovascular: normal heart rate noted  Respiratory: normal respiratory effort, no distress  Abdomen: soft, non-tender   Pelvic: VULVA: normal appearing vulva with no masses, tenderness or lesions, VAGINA: normal appearing vagina with normal color and discharge, no lesions, CERVIX: normal appearing cervix without discharge or lesions, IUD strings visible  Extremities: no edema   No results found for this or any previous visit (from the past 24 hour(s)).  Assessment & Plan:  1) Mirena IUD check>  in place  2) Bleeding w/ IUD> discussed can be irregular x 3-456mths, let us know at 3mths if still bleeding daily, will consider megace  No orders of the defined types were placed in this encounter.   Return for Feb for , Pap & physical.  Marge DuncansBooker, Kimberly Randall CNM, [facility] 10/02/2017 4:13 PM

## 2017-10-02 NOTE — Patient Instructions (Signed)

## 2017-10-11 DIAGNOSIS — Z6821 Body mass index (BMI) 21.0-21.9, adult: Secondary | ICD-10-CM | POA: Diagnosis not present

## 2017-10-11 DIAGNOSIS — Z23 Encounter for immunization: Secondary | ICD-10-CM | POA: Diagnosis not present

## 2017-10-11 DIAGNOSIS — F419 Anxiety disorder, unspecified: Secondary | ICD-10-CM | POA: Diagnosis not present

## 2017-10-26 DIAGNOSIS — F419 Anxiety disorder, unspecified: Secondary | ICD-10-CM | POA: Diagnosis not present

## 2017-10-26 DIAGNOSIS — Z6821 Body mass index (BMI) 21.0-21.9, adult: Secondary | ICD-10-CM | POA: Diagnosis not present

## 2017-10-26 DIAGNOSIS — Z1389 Encounter for screening for other disorder: Secondary | ICD-10-CM | POA: Diagnosis not present

## 2017-11-28 ENCOUNTER — Encounter: Payer: Self-pay | Admitting: Women's Health

## 2017-11-28 ENCOUNTER — Other Ambulatory Visit: Payer: Self-pay | Admitting: Women's Health

## 2017-11-30 ENCOUNTER — Other Ambulatory Visit: Payer: Self-pay | Admitting: Women's Health

## 2017-11-30 MED ORDER — MEGESTROL ACETATE 40 MG PO TABS: ORAL_TABLET | ORAL | 1 refills | Status: DC

## 2017-11-30 NOTE — Progress Notes (Signed)
Pt sent mychart message with continued vaginal bleeding after IUD insertion 09/04/17, requesting megace rx. Rx sent. Let us know if doesn't improve.  Cheral MarkerKimberly R. Douglas Rooks, CNM, [facility] 11/30/2017 9:25 AM

## 2017-12-05 IMAGING — CT CT HEAD W/O CM
3 series · 16 of 46 positions shown, 19 images · non-contrast
Comparison: None.

CLINICAL DATA: Frontal and posterior headache for 1 month. No known
injury.

EXAM:
CT HEAD WITHOUT CONTRAST
TECHNIQUE: Contiguous axial images were obtained from the base of the skull
through the vertex without intravenous contrast.

[Series 2: head wo · axial · 0.39mm/px · z∈[+1391,+1511]mm · 10 of 29 slices shown, 13 images]
[im 3/29  brain]
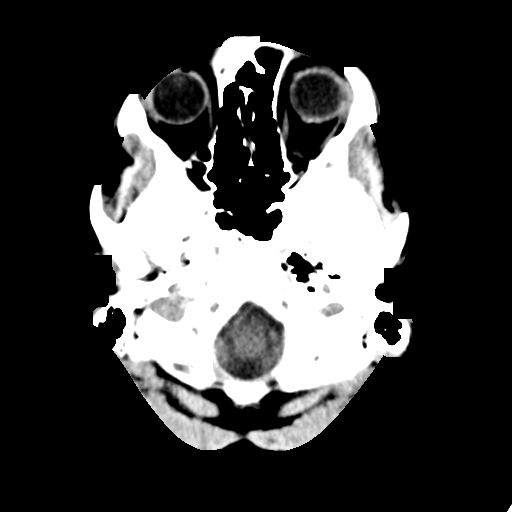
[im 3/29  bone]
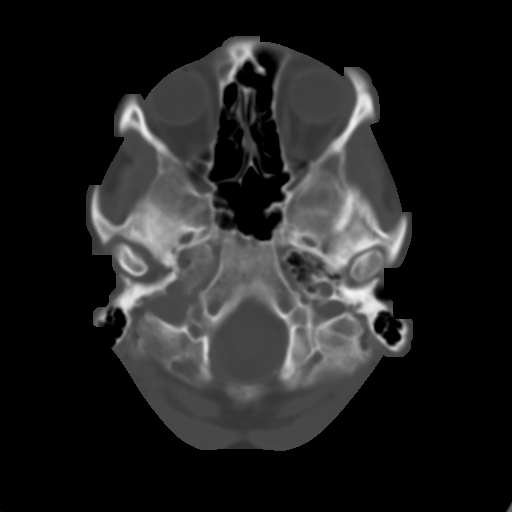
[im 6/29  brain]
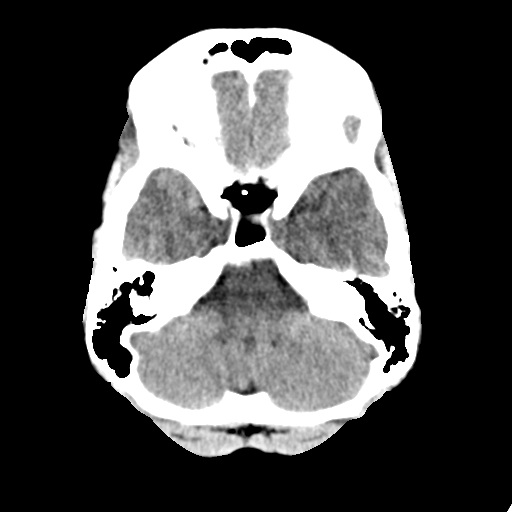
[im 8/29  brain]
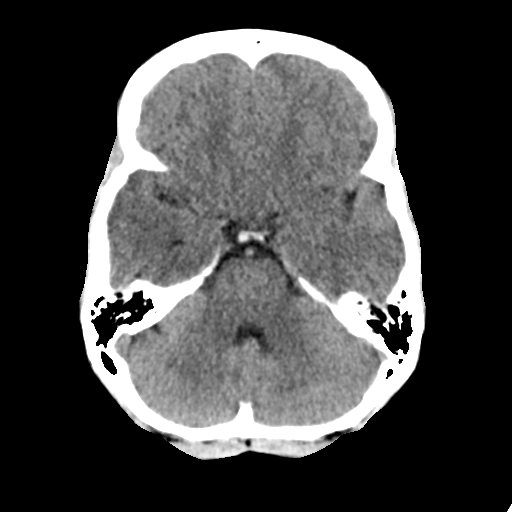
[im 11/29  brain]
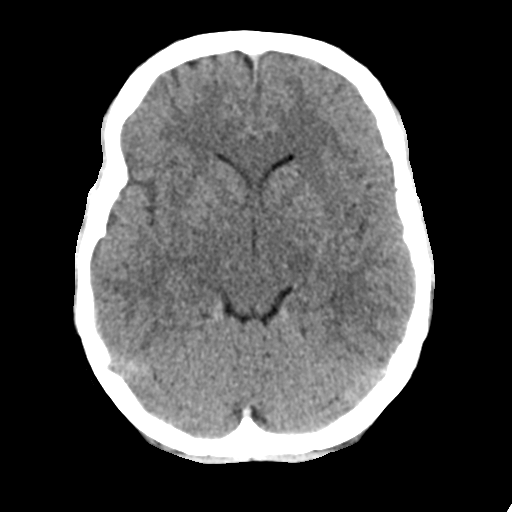
[im 14/29  brain]
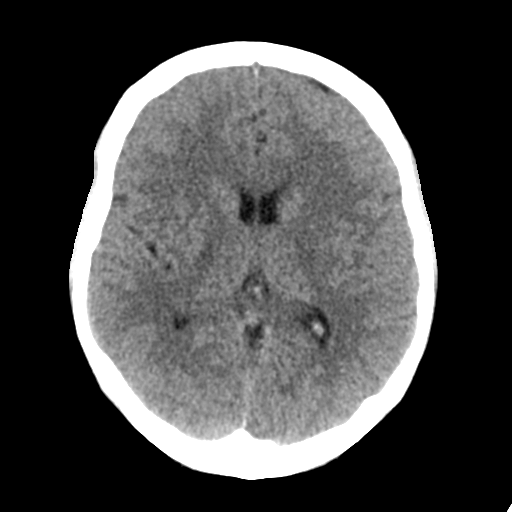
[im 14/29  bone]
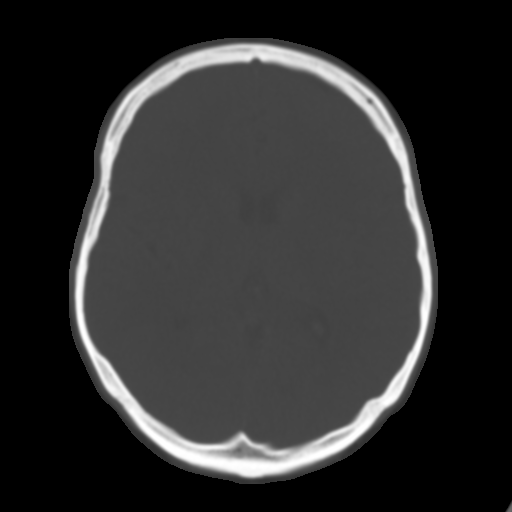
[im 16/29  brain]
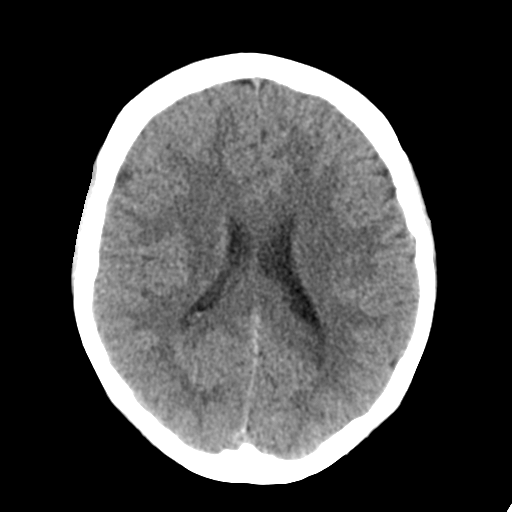
[im 19/29  brain]
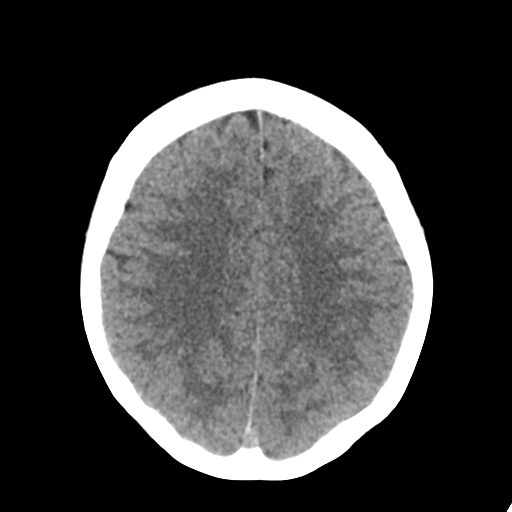
[im 22/29  brain]
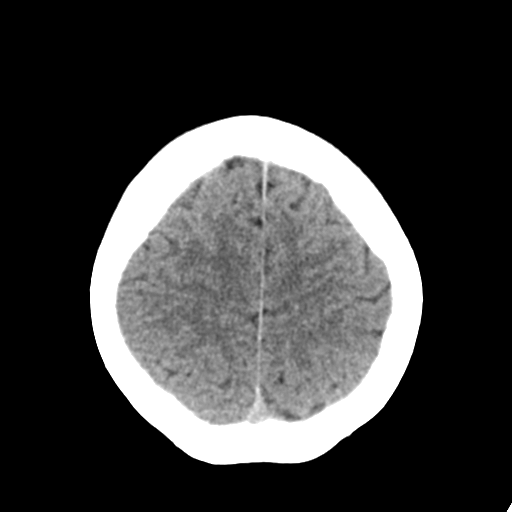
[im 24/29  brain]
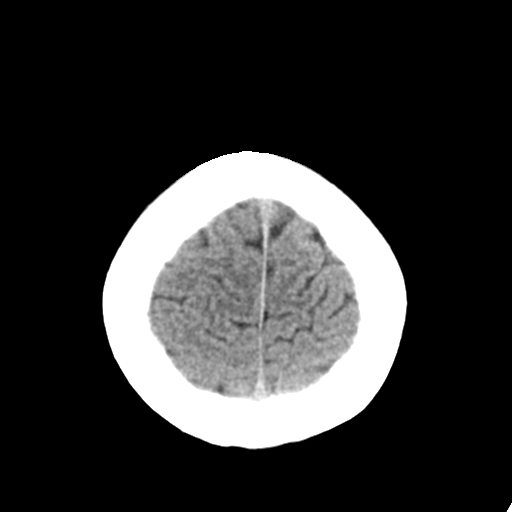
[im 24/29  bone]
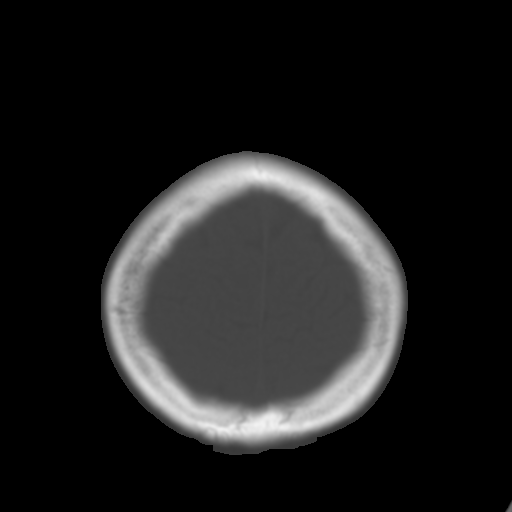
[im 27/29  brain]
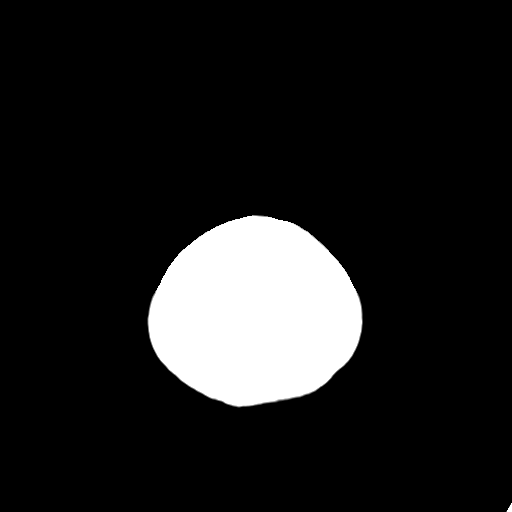

[Series 4: coronal soft tissue · coronal · 0.29mm/px · 3 of 66 slices shown]
[im 22/66  brain]
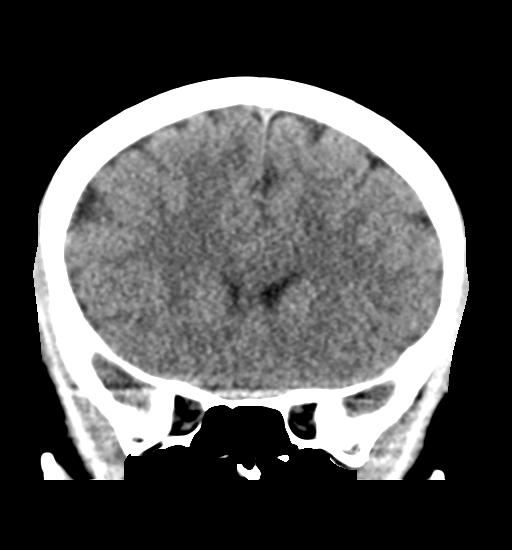
[im 29/66  brain]
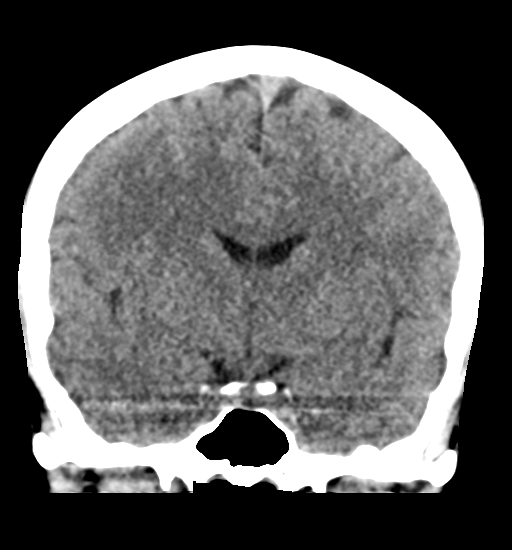
[im 37/66  brain]
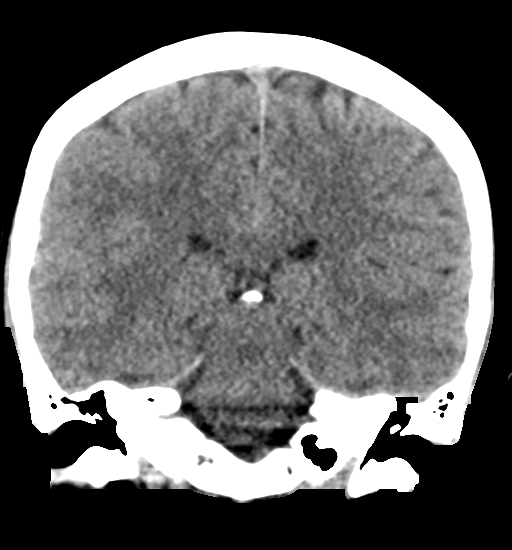

[Series 5: sagittal soft tissue · sagittal · 0.31mm/px · 3 of 52 slices shown]
[im 18/52  brain]
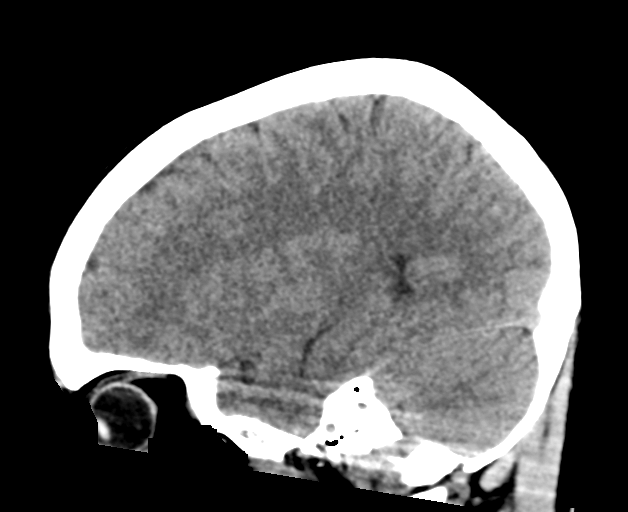
[im 26/52  brain]
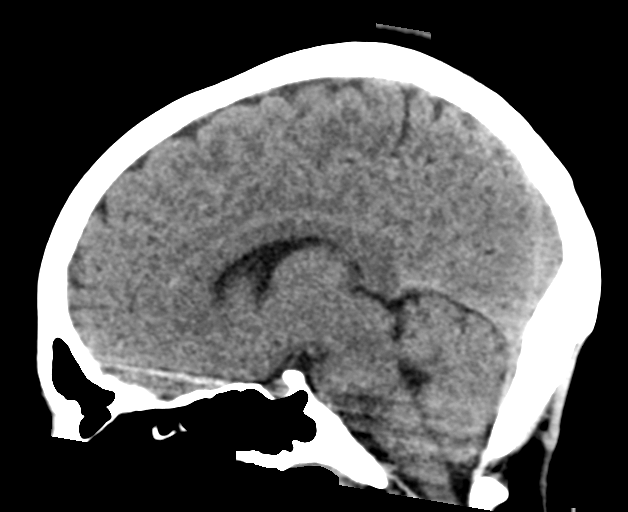
[im 35/52  brain]
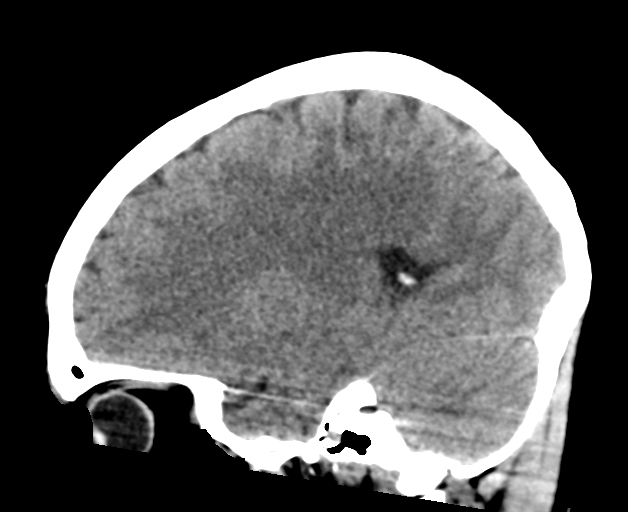

[16 of 46 positions shown; findings below may reference images not displayed]

FINDINGS: Brain: No acute intracranial abnormality. Specifically, no
hemorrhage, hydrocephalus, mass lesion, acute infarction, or
significant intracranial injury.

Vascular: No hyperdense vessel or unexpected calcification.

Skull: No acute calvarial abnormality.

Sinuses/Orbits: Visualized paranasal sinuses and mastoids clear.
Orbital soft tissues unremarkable.

Other: None
IMPRESSION: Negative.

## 2018-01-22 ENCOUNTER — Telehealth: Payer: Self-pay | Admitting: *Deleted

## 2018-01-22 MED ORDER — MEGESTROL ACETATE 40 MG PO TABS: ORAL_TABLET | ORAL | 1 refills | Status: DC

## 2018-01-22 NOTE — Telephone Encounter (Signed)
Patient states she is taking the megace while she is bleeding and stops it when the bleeding stops but it will be one day and she will start bleeding again. She is considering taking it out unless there are other recommendations. She has also tried Ibuprofen. Please advise.

## 2018-01-22 NOTE — Telephone Encounter (Signed)
Informed patient Megace was sent to pharmacy but needed to make appointment for pap/physical and could discuss then her bleeding. Pt verbalized understanding and appt was made.

## 2018-01-27 DIAGNOSIS — J0101 Acute recurrent maxillary sinusitis: Secondary | ICD-10-CM | POA: Diagnosis not present

## 2018-01-27 DIAGNOSIS — Z681 Body mass index (BMI) 19 or less, adult: Secondary | ICD-10-CM | POA: Diagnosis not present

## 2018-02-05 ENCOUNTER — Encounter: Payer: Self-pay | Admitting: Women's Health

## 2018-02-05 ENCOUNTER — Other Ambulatory Visit (HOSPITAL_COMMUNITY)
Admission: RE | Admit: 2018-02-05 | Discharge: 2018-02-05 | Disposition: A | Discharge status: Home / Self Care | Payer: BLUE CROSS/BLUE SHIELD | Source: Ambulatory Visit | Attending: Obstetrics & Gynecology | Admitting: Obstetrics & Gynecology

## 2018-02-05 ENCOUNTER — Other Ambulatory Visit: Payer: Self-pay

## 2018-02-05 ENCOUNTER — Ambulatory Visit (INDEPENDENT_AMBULATORY_CARE_PROVIDER_SITE_OTHER): Payer: BLUE CROSS/BLUE SHIELD | Admitting: Women's Health

## 2018-02-05 VITALS — BP 102/66 | HR 89 | Ht 62.0 in | Wt 117.0 lb

## 2018-02-05 DIAGNOSIS — Z113 Encounter for screening for infections with a predominantly sexual mode of transmission: Secondary | ICD-10-CM

## 2018-02-05 DIAGNOSIS — Z975 Presence of (intrauterine) contraceptive device: Secondary | ICD-10-CM

## 2018-02-05 DIAGNOSIS — N921 Excessive and frequent menstruation with irregular cycle: Secondary | ICD-10-CM | POA: Diagnosis not present

## 2018-02-05 DIAGNOSIS — Z01419 Encounter for gynecological examination (general) (routine) without abnormal findings: Secondary | ICD-10-CM | POA: Diagnosis not present

## 2018-02-05 DIAGNOSIS — Z01411 Encounter for gynecological examination (general) (routine) with abnormal findings: Secondary | ICD-10-CM

## 2018-02-05 DIAGNOSIS — Z3009 Encounter for other general counseling and advice on contraception: Secondary | ICD-10-CM

## 2018-02-05 NOTE — Progress Notes (Signed)
   WELL-WOMAN EXAMINATION Patient name: Haley Morales Micek MRN [id number]  Date of birth: 1991/04/17 Chief Complaint:   Gynecologic Exam  History of Present Illness:   Haley Morales [place] is a 27 y.o. [id number] Caucasian female being seen today for a routine well-woman FP [facility] exam.  Current complaints: bleeding w/ IUD, takes megace, then bleeding starts right back after stops megace, only spotting. IUD placed 09/04/17. Initially wanted IUD out, OK giving it a little more time to see if bleeding will stop  PCP: [facility]      does not desire labs No LMP recorded. (Menstrual status: IUD). The current method of family planning is IUD Last pap >3064yrs ago. Results were: normal Last mammogram: never. Results were: n/a Last colonoscopy: never. Results were: n/a  Review of Systems:   Pertinent items are noted in HPI Denies any headaches, blurred vision, fatigue, shortness of breath, chest pain, abdominal pain, abnormal vaginal discharge/itching/odor/irritation, problems with periods, bowel movements, urination, or intercourse unless otherwise stated above. Pertinent History Reviewed:  Reviewed past medical,surgical, social and family history.  Reviewed problem list, medications and allergies. Physical Assessment:   Vitals:   02/05/18 1502  BP: 102/66  Pulse: 89  Weight: 117 lb (53.1 kg)  Height: 5\' 2"  (1.575 m)  Body mass index is 21.4 kg/m.        Physical Examination: by Tonia GhentKatie Woods, SNP  General appearance - well appearing, and in no distress  Mental status - alert, oriented to person, place, and time  Psych:  She has a normal mood and affect  Skin - warm and dry, normal color, no suspicious lesions noted  Chest - effort normal, all lung fields clear to auscultation bilaterally  Heart - normal rate and regular rhythm  Neck:  midline trachea, no thyromegaly or nodules  Breasts - declines breast exam  Abdomen - soft, nontender, nondistended, no masses or organomegaly  Pelvic -  VULVA: normal appearing vulva with no masses, tenderness or lesions  VAGINA: normal appearing vagina with normal color and discharge, no lesions  CERVIX: normal appearing cervix without discharge or lesions, no CMT, IUD strings visible  Thin prep pap is done w/ reflex HR HPV cotesting  UTERUS: uterus is felt to be normal size, shape, consistency and nontender   ADNEXA: No adnexal masses or tenderness noted.  Extremities:  No swelling or varicosities noted  No results found for this or any previous visit (from the past 24 hour(s)).  Assessment & Plan:  1) Well-Woman FP [facility]aid Exam  2) Bleeding w/ IUD> pt ok giving it a little more time, will check gc/ct from pap  Labs/procedures today: pap, gc/ct, hiv, rpr  Mammogram @ 25yo or sooner if problems Colonoscopy @25yo  or sooner if problems  Orders Placed This Encounter  Procedures  . HIV antibody  . RPR    Follow-up: Return in about 1 year (around 02/06/2019) for Physical.  Cheral MarkerKimberly R TRUE Garciamartinez CNM, WHNP-BC 02/05/2018 4:09 PM

## 2018-02-09 LAB — CYTOLOGY - PAP
Chlamydia: NEGATIVE
Diagnosis: NEGATIVE
NEISSERIA GONORRHEA: NEGATIVE

## 2018-03-07 DIAGNOSIS — J0101 Acute recurrent maxillary sinusitis: Secondary | ICD-10-CM | POA: Diagnosis not present

## 2018-03-07 DIAGNOSIS — Z6820 Body mass index (BMI) 20.0-20.9, adult: Secondary | ICD-10-CM | POA: Diagnosis not present

## 2018-08-30 ENCOUNTER — Ambulatory Visit (INDEPENDENT_AMBULATORY_CARE_PROVIDER_SITE_OTHER): Payer: BLUE CROSS/BLUE SHIELD | Admitting: Advanced Practice Midwife

## 2018-08-30 ENCOUNTER — Encounter: Payer: Self-pay | Admitting: Advanced Practice Midwife

## 2018-08-30 ENCOUNTER — Other Ambulatory Visit: Payer: Self-pay

## 2018-08-30 VITALS — BP 122/69 | HR 86 | Ht 62.0 in | Wt 124.0 lb

## 2018-08-30 DIAGNOSIS — Z30432 Encounter for removal of intrauterine contraceptive device: Secondary | ICD-10-CM

## 2018-08-30 MED ORDER — NORETHIN-ETH ESTRAD-FE BIPHAS 1 MG-10 MCG / 10 MCG PO TABS: 1.0000 | ORAL_TABLET | Freq: Every day | ORAL | 11 refills | Status: DC

## 2018-08-30 NOTE — Progress Notes (Signed)
  Haley Morales 27 y.o.  Vitals:   08/30/18 1513  BP: 122/69  Pulse: 86   Past Medical History:  Diagnosis Date  . Anxiety   . Depression    Past Surgical History:  Procedure Laterality Date  . NO PAST SURGERIES     family history includes Cancer in her maternal grandfather and maternal grandmother.  Current Outpatient Medications:  .  busPIRone (BUSPAR) 15 MG tablet, TAKE 1/2 TABLET BY MOUTH 2 TIMES DAILY. (Patient taking differently: 1 tablet twice daily), Disp: 30 tablet, Rfl: 2 .  ibuprofen (ADVIL,MOTRIN) 600 MG tablet, Take 1 tablet (600 mg total) by mouth every 6 (six) hours., Disp: 30 tablet, Rfl: 0 .  Norethindrone-Ethinyl Estradiol-Fe Biphas (LO LOESTRIN FE) 1 MG-10 MCG / 10 MCG tablet, Take 1 tablet by mouth daily., Disp: 1 Package, Rfl: 11    Here for IUD removal.  She had the Mirena IUD placed a year ago and would like it removed because it still hurts after sex and still cramps. Her plans for future contraception are COCs.  A graves speculum was placed, and the strings were visible.  They were grasped with a curved Tresa Endo and the IUD easily removed.  Pt given IUD removal f/u instructions.  Start [employer], [facility] for 3 weeks

## 2018-09-19 DIAGNOSIS — F419 Anxiety disorder, unspecified: Secondary | ICD-10-CM | POA: Diagnosis not present

## 2018-09-19 DIAGNOSIS — M545 Low back pain: Secondary | ICD-10-CM | POA: Diagnosis not present

## 2018-09-19 DIAGNOSIS — Z6821 Body mass index (BMI) 21.0-21.9, adult: Secondary | ICD-10-CM | POA: Diagnosis not present

## 2018-10-03 DIAGNOSIS — M5416 Radiculopathy, lumbar region: Secondary | ICD-10-CM | POA: Diagnosis not present

## 2018-10-03 DIAGNOSIS — M545 Low back pain: Secondary | ICD-10-CM | POA: Diagnosis not present

## 2018-10-18 DIAGNOSIS — Z6821 Body mass index (BMI) 21.0-21.9, adult: Secondary | ICD-10-CM | POA: Diagnosis not present

## 2018-10-18 DIAGNOSIS — R197 Diarrhea, unspecified: Secondary | ICD-10-CM | POA: Diagnosis not present

## 2018-10-18 DIAGNOSIS — K5900 Constipation, unspecified: Secondary | ICD-10-CM | POA: Diagnosis not present

## 2018-10-18 DIAGNOSIS — R1084 Generalized abdominal pain: Secondary | ICD-10-CM | POA: Diagnosis not present

## 2018-11-15 DIAGNOSIS — Z23 Encounter for immunization: Secondary | ICD-10-CM | POA: Diagnosis not present

## 2019-01-03 DIAGNOSIS — R1110 Vomiting, unspecified: Secondary | ICD-10-CM | POA: Diagnosis not present

## 2019-01-03 DIAGNOSIS — R197 Diarrhea, unspecified: Secondary | ICD-10-CM | POA: Diagnosis not present

## 2019-01-03 DIAGNOSIS — R509 Fever, unspecified: Secondary | ICD-10-CM | POA: Diagnosis not present

## 2019-01-03 DIAGNOSIS — Z6821 Body mass index (BMI) 21.0-21.9, adult: Secondary | ICD-10-CM | POA: Diagnosis not present

## 2019-01-23 DIAGNOSIS — F419 Anxiety disorder, unspecified: Secondary | ICD-10-CM | POA: Diagnosis not present

## 2019-01-23 DIAGNOSIS — Z6821 Body mass index (BMI) 21.0-21.9, adult: Secondary | ICD-10-CM | POA: Diagnosis not present

## 2019-01-23 DIAGNOSIS — G4700 Insomnia, unspecified: Secondary | ICD-10-CM | POA: Diagnosis not present

## 2019-05-21 DIAGNOSIS — Z6821 Body mass index (BMI) 21.0-21.9, adult: Secondary | ICD-10-CM | POA: Diagnosis not present

## 2019-05-21 DIAGNOSIS — G4700 Insomnia, unspecified: Secondary | ICD-10-CM | POA: Diagnosis not present

## 2019-05-21 DIAGNOSIS — F419 Anxiety disorder, unspecified: Secondary | ICD-10-CM | POA: Diagnosis not present

## 2019-06-20 DIAGNOSIS — B001 Herpesviral vesicular dermatitis: Secondary | ICD-10-CM | POA: Diagnosis not present

## 2019-06-20 DIAGNOSIS — Z6821 Body mass index (BMI) 21.0-21.9, adult: Secondary | ICD-10-CM | POA: Diagnosis not present

## 2019-06-20 DIAGNOSIS — F419 Anxiety disorder, unspecified: Secondary | ICD-10-CM | POA: Diagnosis not present

## 2019-06-20 DIAGNOSIS — G4700 Insomnia, unspecified: Secondary | ICD-10-CM | POA: Diagnosis not present

## 2019-07-05 ENCOUNTER — Telehealth: Payer: Self-pay | Admitting: Advanced Practice Midwife

## 2019-07-05 MED ORDER — NORETHIN-ETH ESTRAD-FE BIPHAS 1 MG-10 MCG / 10 MCG PO TABS: 1.0000 | ORAL_TABLET | Freq: Every day | ORAL | 0 refills | Status: DC

## 2019-07-05 NOTE — Telephone Encounter (Signed)
Pt states that her birth control is out of refills and pt is wanting to see if she can have the medication refilled or if she needs to have an appt.

## 2019-07-05 NOTE — Addendum Note (Signed)
Addended by: Roma Schanz on: 07/05/2019 02:38 PM   Modules accepted: Orders

## 2019-09-13 ENCOUNTER — Other Ambulatory Visit: Payer: Self-pay

## 2019-09-13 DIAGNOSIS — Z20822 Contact with and (suspected) exposure to covid-19: Secondary | ICD-10-CM

## 2019-09-15 LAB — NOVEL CORONAVIRUS, NAA: SARS-CoV-2, NAA: NOT DETECTED

## 2019-09-19 DIAGNOSIS — Z23 Encounter for immunization: Secondary | ICD-10-CM | POA: Diagnosis not present

## 2019-09-19 DIAGNOSIS — Z0000 Encounter for general adult medical examination without abnormal findings: Secondary | ICD-10-CM | POA: Diagnosis not present

## 2019-09-19 DIAGNOSIS — F419 Anxiety disorder, unspecified: Secondary | ICD-10-CM | POA: Diagnosis not present

## 2019-09-19 DIAGNOSIS — Z6821 Body mass index (BMI) 21.0-21.9, adult: Secondary | ICD-10-CM | POA: Diagnosis not present

## 2019-09-19 DIAGNOSIS — G4700 Insomnia, unspecified: Secondary | ICD-10-CM | POA: Diagnosis not present

## 2019-09-30 DIAGNOSIS — J019 Acute sinusitis, unspecified: Secondary | ICD-10-CM | POA: Diagnosis not present

## 2019-09-30 DIAGNOSIS — Z6821 Body mass index (BMI) 21.0-21.9, adult: Secondary | ICD-10-CM | POA: Diagnosis not present

## 2019-10-21 ENCOUNTER — Other Ambulatory Visit: Payer: Self-pay | Admitting: Obstetrics & Gynecology

## 2019-11-30 DIAGNOSIS — Z20828 Contact with and (suspected) exposure to other viral communicable diseases: Secondary | ICD-10-CM | POA: Diagnosis not present

## 2019-12-03 DIAGNOSIS — F419 Anxiety disorder, unspecified: Secondary | ICD-10-CM | POA: Diagnosis not present

## 2019-12-03 DIAGNOSIS — R519 Headache, unspecified: Secondary | ICD-10-CM | POA: Diagnosis not present

## 2019-12-03 DIAGNOSIS — Z6822 Body mass index (BMI) 22.0-22.9, adult: Secondary | ICD-10-CM | POA: Diagnosis not present

## 2019-12-16 ENCOUNTER — Ambulatory Visit: Payer: BC Managed Care – PPO | Attending: Internal Medicine

## 2019-12-16 ENCOUNTER — Other Ambulatory Visit: Payer: Self-pay

## 2019-12-16 DIAGNOSIS — Z20822 Contact with and (suspected) exposure to covid-19: Secondary | ICD-10-CM

## 2019-12-17 LAB — NOVEL CORONAVIRUS, NAA: SARS-CoV-2, NAA: NOT DETECTED

## 2019-12-18 DIAGNOSIS — Z20828 Contact with and (suspected) exposure to other viral communicable diseases: Secondary | ICD-10-CM | POA: Diagnosis not present

## 2019-12-24 DIAGNOSIS — J40 Bronchitis, not specified as acute or chronic: Secondary | ICD-10-CM | POA: Diagnosis not present

## 2019-12-24 DIAGNOSIS — Z20828 Contact with and (suspected) exposure to other viral communicable diseases: Secondary | ICD-10-CM | POA: Diagnosis not present

## 2019-12-27 DIAGNOSIS — U071 COVID-19: Secondary | ICD-10-CM | POA: Diagnosis not present

## 2019-12-27 DIAGNOSIS — K769 Liver disease, unspecified: Secondary | ICD-10-CM | POA: Diagnosis not present

## 2019-12-27 DIAGNOSIS — R079 Chest pain, unspecified: Secondary | ICD-10-CM | POA: Diagnosis not present

## 2019-12-27 DIAGNOSIS — R002 Palpitations: Secondary | ICD-10-CM | POA: Diagnosis not present

## 2020-03-04 DIAGNOSIS — Z1331 Encounter for screening for depression: Secondary | ICD-10-CM | POA: Diagnosis not present

## 2020-03-04 DIAGNOSIS — R519 Headache, unspecified: Secondary | ICD-10-CM | POA: Diagnosis not present

## 2020-03-04 DIAGNOSIS — J309 Allergic rhinitis, unspecified: Secondary | ICD-10-CM | POA: Diagnosis not present

## 2020-03-04 DIAGNOSIS — F419 Anxiety disorder, unspecified: Secondary | ICD-10-CM | POA: Diagnosis not present

## 2020-03-04 DIAGNOSIS — Z6821 Body mass index (BMI) 21.0-21.9, adult: Secondary | ICD-10-CM | POA: Diagnosis not present

## 2020-04-14 HISTORY — PX: BREAST ENHANCEMENT SURGERY: SHX7

## 2021-02-12 ENCOUNTER — Other Ambulatory Visit: Payer: Self-pay

## 2021-02-12 ENCOUNTER — Ambulatory Visit (INDEPENDENT_AMBULATORY_CARE_PROVIDER_SITE_OTHER): Payer: Medicaid Other | Admitting: Women's Health

## 2021-02-12 ENCOUNTER — Encounter: Payer: Self-pay | Admitting: Women's Health

## 2021-02-12 ENCOUNTER — Other Ambulatory Visit (HOSPITAL_COMMUNITY)
Admission: RE | Admit: 2021-02-12 | Discharge: 2021-02-12 | Disposition: A | Discharge status: Home / Self Care | Payer: Medicaid Other | Source: Ambulatory Visit | Attending: Obstetrics & Gynecology | Admitting: Obstetrics & Gynecology

## 2021-02-12 VITALS — BP 118/75 | HR 82 | Ht 62.0 in | Wt 135.6 lb

## 2021-02-12 DIAGNOSIS — Z01419 Encounter for gynecological examination (general) (routine) without abnormal findings: Secondary | ICD-10-CM | POA: Insufficient documentation

## 2021-02-12 NOTE — Progress Notes (Addendum)
WELL-WOMAN EXAMINATION Patient name: Haley Morales MRN [id number]  Date of birth: 01-23-1991 Chief Complaint:   Gynecologic Exam  History of Present Illness:   Haley Morales is a 30 y.o. G67P0101 Caucasian female being seen today for a routine well-woman exam.  Current complaints: Stopped COCs in June. No period Jan/Feb, had one in March. Feb had yeast infection.   Depression screen [facility] 2/9 02/12/2021 02/05/2018 02/08/2017 12/12/2016  Decreased Interest 1 0 0 0  Down, Depressed, Hopeless 1 0 0 0  PHQ - 2 Score 2 0 0 0  Altered sleeping 1 - 0 -  Tired, decreased energy 1 - 0 -  Change in appetite 1 - 0 -  Feeling bad or failure about yourself  1 - 0 -  Trouble concentrating 0 - 0 -  Moving slowly or fidgety/restless 0 - 0 -  Suicidal thoughts 0 - 0 -  PHQ-9 Score 6 - 0 -     PCP: [facility]   does not desire labs Patient's last menstrual period was 02/05/2021 (exact date). The current method of family planning is vasectomy.  Last pap 02/05/18. Results were: NILM w/ HRHPV not done. H/O abnormal pap: no Last mammogram: never. Results were: N/A. Family h/o breast cancer: no Last colonoscopy: never. Results were: N/A. Family h/o colorectal cancer: no Review of Systems:   Pertinent items are noted in HPI Denies any headaches, blurred vision, fatigue, shortness of breath, chest pain, abdominal pain, abnormal vaginal discharge/itching/odor/irritation, problems with periods, bowel movements, urination, or intercourse unless otherwise stated above. Pertinent History Reviewed:  Reviewed past medical,surgical, social and family history.  Reviewed problem list, medications and allergies. Physical Assessment:   Vitals:   02/12/21 1013  BP: 118/75  Pulse: 82  Weight: 135 lb 9.6 oz (61.5 kg)  Height: 5\' 2"  (1.575 m)  Body mass index is 24.8 kg/m.        Physical Examination: by , SNP  General appearance - well appearing, and in no distress  Mental status -  alert, oriented to person, place, and time  Psych:  She has a normal mood and affect  Skin - warm and dry, normal color, no suspicious lesions noted  Chest - effort normal, all lung fields clear to auscultation bilaterally  Heart - normal rate and regular rhythm  Neck:  midline trachea, no thyromegaly or nodules  Breasts - breasts appear normal, no suspicious masses, no skin or nipple changes or  axillary nodes  Abdomen - soft, nontender, nondistended, no masses or organomegaly  Pelvic - VULVA: normal appearing vulva with no masses, tenderness or lesions  VAGINA: normal appearing vagina with normal color and discharge, no lesions  CERVIX: normal appearing cervix without discharge or lesions, no CMT  Thin prep pap is done w/ HR HPV cotesting  UTERUS: uterus is felt to be normal size, shape, consistency and nontender   ADNEXA: No adnexal masses or tenderness noted.  Extremities:  No swelling or varicosities noted  Chaperone: me    No results found for this or any previous visit (from the past 24 hour(s)).  Assessment & Plan:  1) Well-Woman Exam  2) Irregular period> no period Jan/Feb, she and daughter had Covid around that time. Did have period in March. To continue tracking them, if goes April without a period let know  Labs/procedures today: pap  Mammogram: @ 28yo, or sooner if problems Colonoscopy: @ 28yo, or sooner if problems  No orders of the defined types were placed in this  encounter.   Meds: No orders of the defined types were placed in this encounter.   Follow-up: Return in about 1 year (around 02/12/2022) for Physical.  Cheral Marker CNM, WHNP-BC 02/12/2021 11:02 AM

## 2021-02-15 LAB — CYTOLOGY - PAP
Chlamydia: NEGATIVE
Comment: NEGATIVE
Comment: NEGATIVE
Comment: NORMAL
Diagnosis: NEGATIVE
High risk HPV: NEGATIVE
Neisseria Gonorrhea: NEGATIVE

## 2021-11-12 ENCOUNTER — Other Ambulatory Visit (HOSPITAL_COMMUNITY): Payer: Self-pay

## 2021-11-12 MED ORDER — BUPROPION HCL ER (XL) 150 MG PO TB24
300.0000 mg | ORAL_TABLET | Freq: Every day | ORAL | 1 refills | Status: DC
  Filled 2021-11-12: qty 180, 90d supply, fill #0
  Filled 2022-01-28 – 2022-01-29 (×2): qty 120, 60d supply, fill #1

## 2021-11-29 ENCOUNTER — Other Ambulatory Visit (HOSPITAL_COMMUNITY): Payer: Self-pay

## 2021-11-29 MED ORDER — ALPRAZOLAM 0.5 MG PO TABS
ORAL_TABLET | ORAL | 0 refills | Status: DC
  Filled 2021-12-30: qty 90, 30d supply, fill #0

## 2021-11-29 MED ORDER — ALPRAZOLAM 0.5 MG PO TABS
ORAL_TABLET | ORAL | 0 refills | Status: DC
  Filled 2022-02-14: qty 90, 30d supply, fill #0

## 2021-11-29 MED ORDER — ACYCLOVIR 400 MG PO TABS
ORAL_TABLET | ORAL | 0 refills | Status: DC
  Filled 2021-11-29 (×2): qty 21, 7d supply, fill #0

## 2021-11-29 MED ORDER — ALPRAZOLAM 0.5 MG PO TABS
ORAL_TABLET | ORAL | 0 refills | Status: DC
  Filled 2022-03-21: qty 90, 30d supply, fill #0

## 2021-12-06 ENCOUNTER — Other Ambulatory Visit (HOSPITAL_COMMUNITY): Payer: Self-pay

## 2021-12-06 MED ORDER — UBRELVY 50 MG PO TABS
50.0000 mg | ORAL_TABLET | ORAL | 0 refills | Status: DC
  Filled 2021-12-06 (×2): qty 10, 30d supply, fill #0

## 2021-12-07 ENCOUNTER — Other Ambulatory Visit (HOSPITAL_COMMUNITY): Payer: Self-pay

## 2021-12-07 MED ORDER — NURTEC 75 MG PO TBDP
ORAL_TABLET | ORAL | 0 refills | Status: DC
  Filled 2021-12-07: qty 9, 30d supply, fill #0

## 2021-12-08 ENCOUNTER — Other Ambulatory Visit (HOSPITAL_COMMUNITY): Payer: Self-pay

## 2021-12-21 ENCOUNTER — Other Ambulatory Visit (HOSPITAL_COMMUNITY): Payer: Self-pay

## 2021-12-21 MED ORDER — PREDNISONE 20 MG PO TABS
ORAL_TABLET | ORAL | 0 refills | Status: DC
  Filled 2021-12-21: qty 8, 4d supply, fill #0

## 2021-12-21 MED ORDER — AMOXICILLIN-POT CLAVULANATE 875-125 MG PO TABS
ORAL_TABLET | ORAL | 0 refills | Status: DC
  Filled 2021-12-21: qty 14, 7d supply, fill #0

## 2021-12-23 ENCOUNTER — Other Ambulatory Visit (HOSPITAL_COMMUNITY): Payer: Self-pay

## 2021-12-30 ENCOUNTER — Other Ambulatory Visit (HOSPITAL_COMMUNITY): Payer: Self-pay

## 2022-01-29 ENCOUNTER — Other Ambulatory Visit (HOSPITAL_COMMUNITY): Payer: Self-pay

## 2022-01-30 ENCOUNTER — Encounter: Payer: Self-pay | Admitting: Women's Health

## 2022-01-31 ENCOUNTER — Other Ambulatory Visit (HOSPITAL_COMMUNITY): Payer: Self-pay

## 2022-01-31 ENCOUNTER — Telehealth: Payer: Self-pay | Admitting: Obstetrics & Gynecology

## 2022-01-31 NOTE — Telephone Encounter (Signed)
I scheduled patient for her pap/physical on 4/21. She wanted to know if she needed to get her labs done also. Please advise.  ?

## 2022-02-14 ENCOUNTER — Other Ambulatory Visit (HOSPITAL_COMMUNITY): Payer: Self-pay

## 2022-02-14 MED ORDER — BUPROPION HCL ER (XL) 150 MG PO TB24
ORAL_TABLET | ORAL | 1 refills | Status: DC
  Filled 2022-02-14 – 2022-03-21 (×2): qty 180, 90d supply, fill #0

## 2022-02-21 ENCOUNTER — Other Ambulatory Visit (HOSPITAL_COMMUNITY): Payer: Self-pay

## 2022-02-21 MED ORDER — LO LOESTRIN FE 1 MG-10 MCG / 10 MCG PO TABS
ORAL_TABLET | ORAL | 3 refills | Status: DC
  Filled 2022-02-21: qty 28, 28d supply, fill #0

## 2022-03-04 ENCOUNTER — Other Ambulatory Visit: Payer: Medicaid Other | Admitting: Obstetrics & Gynecology

## 2022-03-14 ENCOUNTER — Ambulatory Visit (INDEPENDENT_AMBULATORY_CARE_PROVIDER_SITE_OTHER): Payer: 59 | Admitting: Obstetrics & Gynecology

## 2022-03-14 ENCOUNTER — Encounter: Payer: Self-pay | Admitting: Obstetrics & Gynecology

## 2022-03-14 ENCOUNTER — Other Ambulatory Visit (HOSPITAL_COMMUNITY): Payer: Self-pay

## 2022-03-14 VITALS — BP 114/78 | HR 84 | Ht 62.0 in | Wt 123.8 lb

## 2022-03-14 DIAGNOSIS — Z01419 Encounter for gynecological examination (general) (routine) without abnormal findings: Secondary | ICD-10-CM

## 2022-03-14 DIAGNOSIS — Z01411 Encounter for gynecological examination (general) (routine) with abnormal findings: Secondary | ICD-10-CM

## 2022-03-14 DIAGNOSIS — N946 Dysmenorrhea, unspecified: Secondary | ICD-10-CM

## 2022-03-14 MED ORDER — LO LOESTRIN FE 1 MG-10 MCG / 10 MCG PO TABS
1.0000 | ORAL_TABLET | Freq: Every day | ORAL | 4 refills | Status: DC
  Filled 2022-03-14: qty 84, 84d supply, fill #0

## 2022-03-14 NOTE — Progress Notes (Signed)
? ?WELL-WOMAN EXAMINATION ?Patient name: Haley Morales MRN [id number]  Date of birth: [birth year] ?Chief Complaint:   ?Annual Exam ? ?History of Present Illness:   ?Haley Morales is a 31 y.o. G6P0101  female being seen today for a routine well-woman exam.  ? ?Restarted on OCPs due to significant dysmenorrhea.  Recently started her menses, much lighter and minimal cramping.  This has worked well for her in the past and wishes to continue. ? ?In review, previously had IUD, but removed in 2019.  When period returned while the bleeding was tolerable noted horrible cramping- significant impact on QOL. ? ?Husband had vasectomy for contraception.  ? ?Patient's last menstrual period was 03/11/2022 (exact date). ?Denies issues with her menses ?The current method of family planning is vasectomy.  ? ? ?Last pap 02/2021.  ?Last mammogram: n/a. ?Last colonoscopy: n/a ? ? ?  03/14/2022  ? 10:16 AM 02/12/2021  ? 10:17 AM 02/05/2018  ?  3:05 PM 02/08/2017  ?  2:21 PM 12/12/2016  ?  3:18 PM  ?Depression screen PHQ 2/9  ?Decreased Interest 1 1 0 0 0  ?Down, Depressed, Hopeless 0 1 0 0 0  ?PHQ - 2 Score 1 2 0 0 0  ?Altered sleeping 0 1  0   ?Tired, decreased energy 0 1  0   ?Change in appetite 1 1  0   ?Feeling bad or failure about yourself  0 1  0   ?Trouble concentrating 0 0  0   ?Moving slowly or fidgety/restless 0 0  0   ?Suicidal thoughts 0 0  0   ?PHQ-9 Score 2 6  0   ? ? ? ? ?Review of Systems:   ?Pertinent items are noted in HPI ?Denies any headaches, blurred vision, fatigue, shortness of breath, chest pain, abdominal pain, bowel movements, urination, or intercourse unless otherwise stated above. ? ?Pertinent History Reviewed:  ?Reviewed past medical,surgical, social and family history.  ?Reviewed problem list, medications and allergies. ?Physical Assessment:  ? ?Vitals:  ? 03/14/22 1019  ?BP: 114/78  ?Pulse: 84  ?Weight: 123 lb 12.8 oz (56.2 kg)  ?Height: 5\' 2"  (1.575 m)  ?Body mass index is 22.64 kg/m?. ?  ?     Physical  Examination:  ? General appearance - well appearing, and in no distress ? Mental status - alert, oriented to person, place, and time ? Psych:  She has a normal mood and affect ? Skin - warm and dry, normal color, no suspicious lesions noted ? Chest - effort normal, all lung fields clear to auscultation bilaterally ? Heart - normal rate and regular rhythm ? Neck:  midline trachea, no thyromegaly or nodules ? Breasts - breasts appear normal, no suspicious masses, no skin or nipple changes or  axillary nodes, implants noted bilaterally ? Abdomen - soft, nontender, nondistended, no masses or organomegaly ? Pelvic - VULVA: normal appearing vulva with no masses, tenderness or lesions  VAGINA: normal appearing vagina with normal color and discharge, no lesions  CERVIX: normal appearing cervix without discharge or lesions, no CMT ? UTERUS: uterus is felt to be normal size, shape, consistency and nontender  ? ADNEXA: No adnexal masses or tenderness noted. ? Extremities:  No swelling or varicosities noted ? ?Chaperone:  pt declined    ? ? ?Assessment & Plan:  ?1) Well-Woman Exam ?-pap up to date, reviewed screening guidelines ? ?2) Dysmenorrhea ?- doing well with OCPs and plan to continue ? ? ?Meds:  ?Meds ordered this encounter  ?  Medications  ? Norethindrone-Ethinyl Estradiol-Fe Biphas (LO LOESTRIN FE) 1 MG-10 MCG / 10 MCG tablet  ?  Sig: Take 1 tablet by mouth daily.  ?  Dispense:  84 tablet  ?  Refill:  4  ? ? ?Follow-up: Return in about 1 year (around 03/15/2023) for Annual. ? ? ?Myna Hidalgo, DO ?Attending Obstetrician & Gynecologist, Faculty Practice ?[facility] [employer], [facility] ? ? ?

## 2022-03-21 ENCOUNTER — Other Ambulatory Visit (HOSPITAL_COMMUNITY): Payer: Self-pay

## 2022-03-31 ENCOUNTER — Other Ambulatory Visit (HOSPITAL_COMMUNITY)
Admission: RE | Admit: 2022-03-31 | Discharge: 2022-03-31 | Disposition: A | Discharge status: Home / Self Care | Payer: 59 | Source: Ambulatory Visit | Attending: Physician Assistant | Admitting: Physician Assistant

## 2022-03-31 DIAGNOSIS — R002 Palpitations: Secondary | ICD-10-CM | POA: Insufficient documentation

## 2022-03-31 LAB — TSH: TSH: 0.993 u[IU]/mL (ref 0.350–4.500)

## 2022-03-31 LAB — T4, FREE: Free T4: 1.12 ng/dL (ref 0.61–1.12)

## 2022-04-06 ENCOUNTER — Other Ambulatory Visit: Payer: Self-pay

## 2022-04-06 ENCOUNTER — Ambulatory Visit (INDEPENDENT_AMBULATORY_CARE_PROVIDER_SITE_OTHER): Payer: 59

## 2022-04-06 DIAGNOSIS — R000 Tachycardia, unspecified: Secondary | ICD-10-CM

## 2022-04-18 ENCOUNTER — Other Ambulatory Visit (HOSPITAL_COMMUNITY): Payer: Self-pay

## 2022-04-19 ENCOUNTER — Other Ambulatory Visit (HOSPITAL_COMMUNITY): Payer: Self-pay

## 2022-04-19 MED ORDER — ACYCLOVIR 400 MG PO TABS
ORAL_TABLET | ORAL | 0 refills | Status: DC
  Filled 2022-04-19: qty 21, 7d supply, fill #0

## 2022-04-19 MED ORDER — ALPRAZOLAM 0.5 MG PO TABS
ORAL_TABLET | ORAL | 0 refills | Status: DC
  Filled 2022-04-19: qty 90, 30d supply, fill #0

## 2022-04-21 ENCOUNTER — Encounter (HOSPITAL_BASED_OUTPATIENT_CLINIC_OR_DEPARTMENT_OTHER): Payer: Self-pay | Admitting: Cardiology

## 2022-04-21 ENCOUNTER — Ambulatory Visit (INDEPENDENT_AMBULATORY_CARE_PROVIDER_SITE_OTHER): Payer: 59 | Admitting: Cardiology

## 2022-04-21 VITALS — BP 123/85 | HR 103 | Ht 62.0 in | Wt 119.8 lb

## 2022-04-21 DIAGNOSIS — Z712 Person consulting for explanation of examination or test findings: Secondary | ICD-10-CM | POA: Diagnosis not present

## 2022-04-21 DIAGNOSIS — R079 Chest pain, unspecified: Secondary | ICD-10-CM

## 2022-04-21 DIAGNOSIS — Z7189 Other specified counseling: Secondary | ICD-10-CM

## 2022-04-21 DIAGNOSIS — R002 Palpitations: Secondary | ICD-10-CM

## 2022-04-21 NOTE — Progress Notes (Signed)
Cardiology Office Note:    Date:  04/21/2022   ID:  Haley Morales, DOB 02-05-1991, MRN [id number]  PCP:  Royann Shivers, PA-C  Cardiologist:  Jodelle Red, MD  Referring MD: Royann Shivers, *   CC: new patient consultation for palpitations/tachycardia  History of Present Illness:    Haley Morales is a 31 y.o. female with a hx of depression, who is seen as a new consult at the request of Royann Shivers, * for the evaluation and management of tachycardia.  Referral notes from [facility], [place]. At their visit 03/29/2022 she reported palpitations with onset 3 months ago. Her EKG showed sinus tachycardia. She was referred to cardiology for further evaluation.  Tachycardia/palpitations: -Initial onset: About 2 months ago -Frequency/Duration: Palpitations occur every day. -Associated symptoms: Chest tightness, at least 3 times a week. Usually occurs at work. Can experience chest tightness with housework as well, but it is usually a short duration. When she notices the chest tightness, she will check her smart watch and notice heart rates as high as the 120's. -Aggravating/alleviating factors: None. Usually resolves spontaneously. -Syncope/near syncope:  None. -Prior cardiac history:  None -Prior workup: She wore a heart monitor which showed predominantly sinus rhythm and average heart rate 93 bpm, and patient triggered episodes corresponding to sinus tachycardia from 100-120 bpm. Rare PAC's and PVC's. -Possible medication interactions: -Caffeine: May have a coffee in the mornings, one espresso shot from [employer] lately.  -Tobacco: Never. -OTC supplements: -Comorbidities:  None. -Exercise level: She is constantly caring for her 58 yr old daughter. Not much formal exercise, but she denies feeling physically limited. Walks up a steep hill to the end of her driveway. -Labs: TSH, kidney function/electrolytes, CBC reviewed. -Cardiac ROS: no chest  pain, no shortness of breath, no PND, no orthopnea, no LE edema. -Family history: Her mother has palpitations, on metoprolol for years. Possibly a heart attack in her paternal great grandparents. -Current diet:  Typically she does not eat breakfast. She does not order out. May have sandwiches at lunch provided at work.  She endorses anxiety, but denies any correlation with her palpitations.  The other day when she had blood drawn she nearly passed out.  She denies any palpitations, chest pain, or peripheral edema. No lightheadedness, headaches, orthopnea, or PND.  Past Medical History:  Diagnosis Date   Anxiety    Depression     Past Surgical History:  Procedure Laterality Date   BREAST ENHANCEMENT SURGERY Bilateral 04/2020    Current Medications: Current Outpatient Medications on File Prior to Visit  Medication Sig   acyclovir (ZOVIRAX) 400 MG tablet TAKE 1 TABLET BY MOUTH 3 TIMES DAILY   ALPRAZolam (XANAX) 0.5 MG tablet TAKE 1 TABLET BY MOUTH 3 TIMES A DAY AS NEEDED   buPROPion (WELLBUTRIN XL) 150 MG 24 hr tablet Take 2 tablets by mouth every day .   Norethindrone-Ethinyl Estradiol-Fe Biphas (LO LOESTRIN FE) 1 MG-10 MCG / 10 MCG tablet Take 1 tablet by mouth daily.   Rimegepant Sulfate (NURTEC) 75 MG TBDP Dissolve 1 tablet by mouth as directed at onset of headache.   No current facility-administered medications on file prior to visit.     Allergies:   Paxil [paroxetine hcl]   Social History   Tobacco Use   Smoking status: Never   Smokeless tobacco: Never  Vaping Use   Vaping Use: Never used  Substance Use Topics   Alcohol use: Not Currently    Comment: occasional before pregnancy  Drug use: No    Family History: family history includes Cancer in her maternal grandfather and maternal grandmother.  ROS:   Please see the history of present illness.  Additional pertinent ROS: Constitutional: Negative for chills, fever, night sweats, unintentional weight loss   HENT: Negative for ear pain and hearing loss.   Eyes: Negative for loss of vision and eye pain.  Respiratory: Negative for cough, sputum, wheezing.   Cardiovascular: See HPI. Gastrointestinal: Negative for abdominal pain, melena, and hematochezia.  Genitourinary: Negative for dysuria and hematuria.  Musculoskeletal: Negative for falls and myalgias.  Skin: Negative for itching and rash.  Neurological: Negative for focal weakness, focal sensory changes and loss of consciousness. Positive for anxiety. Endo/Heme/Allergies: Does not bruise/bleed easily.     EKGs/Labs/Other Studies Reviewed:    The following studies were reviewed today:  Monitor  04/2022: ZIO XT reviewed.  6 days, 9 hours analyzed.   1.  Predominant rhythm is sinus with heart rate ranging from 57 bpm up to 165 bpm and average heart rate 93 bpm. 2.  There were rare PACs representing less than 1% total beats.  There were also rare PVCs including ventricular couplets representing less than 1% total beats. 3.  Patient triggered episodes corresponded to sinus tachycardia ranging from 100 to 120 bpm. 4.  There were no arrhythmias or pauses.  EKG:  EKG is personally reviewed.   04/21/2022: sinus tachycardia at 107 bpm  Recent Labs: 03/31/2022: TSH 0.993   Recent Lipid Panel No results found for: "CHOL", "TRIG", "HDL", "CHOLHDL", "VLDL", "LDLCALC", "LDLDIRECT"  Physical Exam:    VS:  BP 123/85   Pulse (!) 103   Ht 5\' 2"  (1.575 m)   Wt 119 lb 12.8 oz (54.3 kg)   SpO2 99%   BMI 21.91 kg/m     Wt Readings from Last 3 Encounters:  04/21/22 119 lb 12.8 oz (54.3 kg)  03/14/22 123 lb 12.8 oz (56.2 kg)  02/12/21 135 lb 9.6 oz (61.5 kg)    GEN: Well nourished, well developed in no acute distress HEENT: Normal, moist mucous membranes NECK: No JVD CARDIAC: regular rhythm, normal S1 and S2, no rubs or gallops. No murmur. VASCULAR: Radial and DP pulses 2+ bilaterally. No carotid bruits RESPIRATORY:  Clear to auscultation  without rales, wheezing or rhonchi  ABDOMEN: Soft, non-tender, non-distended MUSCULOSKELETAL:  Ambulates independently SKIN: Warm and dry, no edema NEUROLOGIC:  Alert and oriented x 3. No focal neuro deficits noted. PSYCHIATRIC:  Normal affect    ASSESSMENT:    1. Chest pain of uncertain etiology   2. Heart palpitations   3. Cardiac risk counseling   4. Counseling on health promotion and disease prevention   5. Encounter to discuss test results    PLAN:    Palpitations Chest tightness with tachycardia -reviewed monitor results with her, reassuring -discussed difference between sinus tachycardia and arrhythmia at length. Discussed that in general, we do not treat sinus tachycardia by itself, as this is usually driven by something else. Discussed the balance between the fight or flight and resting nervous systems (sympathetic and parasympathetic) and how these affect heart rate. Discussed that peak exercise heart rate is 220-age. Discussed that resting heart rate can be affected by exercise conditioning. -discussed that buproprion can sometimes be associated with tachycardia/palpitations, but she is doing well on this -reviewed red flag warning signs that need immediate medical attention  Cardiac risk counseling and prevention recommendations: -recommend heart healthy/Mediterranean diet, with whole grains, fruits, vegetable, fish, lean  meats, nuts, and olive oil. Limit salt. -recommend moderate walking, 3-5 times/week for 30-50 minutes each session. Aim for at least 150 minutes.week. Goal should be pace of 3 miles/hours, or walking 1.5 miles in 30 minutes -recommend avoidance of tobacco products. Avoid excess alcohol. -ASCVD risk score: The ASCVD Risk score (Arnett DK, et al., 2019) failed to calculate for the following reasons:   The 2019 ASCVD risk score is only valid for ages 19 to 13    Plan for follow up: PRN.  Jodelle Red, MD, PhD, [facility]  [facility]     Medication Adjustments/Labs and Tests Ordered: Current medicines are reviewed at length with the patient today.  Concerns regarding medicines are outlined above.   Orders Placed This Encounter  Procedures   EKG 12-Lead   No orders of the defined types were placed in this encounter.  Patient Instructions  Medication Instructions:  Your Physician recommend you continue on your current medication as directed.    *If you need a refill on your cardiac medications before your next appointment, please call your pharmacy*   Lab Work: None ordered today   Testing/Procedures: None ordered today   Follow-Up: At [facility], you and your health needs are our priority.  As part of our continuing mission to provide you with exceptional heart care, we have created designated Provider Care Teams.  These Care Teams include your primary Cardiologist (physician) and Advanced Practice Providers (APPs -  Physician Assistants and Nurse Practitioners) who all work together to provide you with the care you need, when you need it.  We recommend signing up for the patient portal called "MyChart".  Sign up information is provided on this After Visit Summary.  MyChart is used to connect with patients for Virtual Visits (Telemedicine).  Patients are able to view lab/test results, encounter notes, upcoming appointments, etc.  Non-urgent messages can be sent to your provider as well.   To learn more about what you can do with MyChart, go to [email or web address].    Your next appointment:   As needed  The format for your next appointment:   In Person  Provider:   Jodelle Red, MD{           I,Haley Morales,acting as a scribe for Jodelle Red, MD.,have documented all relevant documentation on the behalf of Jodelle Red, MD,as directed by  Jodelle Red, MD while in the presence of Jodelle Red, MD.  I, Jodelle Red, MD, have reviewed all  documentation for this visit. The documentation on 05/16/22 for the exam, diagnosis, procedures, and orders are all accurate and complete.   Signed, Jodelle Red, MD PhD 04/21/2022     [facility]

## 2022-04-21 NOTE — Patient Instructions (Signed)

## 2022-04-27 ENCOUNTER — Other Ambulatory Visit (HOSPITAL_COMMUNITY): Payer: Self-pay

## 2022-05-14 ENCOUNTER — Other Ambulatory Visit (HOSPITAL_COMMUNITY): Payer: Self-pay

## 2022-05-16 ENCOUNTER — Other Ambulatory Visit (HOSPITAL_COMMUNITY): Payer: Self-pay

## 2022-05-16 MED ORDER — ALPRAZOLAM 0.5 MG PO TABS
ORAL_TABLET | ORAL | 0 refills | Status: DC
  Filled 2022-05-19: qty 90, 30d supply, fill #0

## 2022-05-19 ENCOUNTER — Other Ambulatory Visit (HOSPITAL_COMMUNITY): Payer: Self-pay

## 2022-05-20 ENCOUNTER — Other Ambulatory Visit (HOSPITAL_COMMUNITY): Payer: Self-pay

## 2022-05-26 ENCOUNTER — Other Ambulatory Visit (HOSPITAL_COMMUNITY): Payer: Self-pay

## 2022-05-26 MED ORDER — TRETINOIN MICROSPHERE 0.1 % EX GEL
CUTANEOUS | 1 refills | Status: DC
  Filled 2022-05-26: qty 20, 30d supply, fill #0

## 2022-05-26 MED ORDER — ALPRAZOLAM 0.5 MG PO TABS
ORAL_TABLET | ORAL | 0 refills | Status: DC
  Filled 2022-06-23: qty 90, 30d supply, fill #0

## 2022-05-26 MED ORDER — NURTEC 75 MG PO TBDP
1.0000 | ORAL_TABLET | ORAL | 0 refills | Status: DC
  Filled 2022-05-26: qty 9, 30d supply, fill #0

## 2022-05-26 MED ORDER — CLINDAMYCIN PHOS-BENZOYL PEROX 1-5 % EX GEL
CUTANEOUS | 0 refills | Status: DC
  Filled 2022-05-26: qty 45, 30d supply, fill #0

## 2022-05-26 MED ORDER — LO LOESTRIN FE 1 MG-10 MCG / 10 MCG PO TABS
ORAL_TABLET | ORAL | 0 refills | Status: DC
  Filled 2022-05-26: qty 84, 84d supply, fill #0

## 2022-06-23 ENCOUNTER — Other Ambulatory Visit (HOSPITAL_COMMUNITY): Payer: Self-pay

## 2022-06-27 ENCOUNTER — Other Ambulatory Visit (HOSPITAL_COMMUNITY): Payer: Self-pay

## 2022-07-15 ENCOUNTER — Other Ambulatory Visit (HOSPITAL_COMMUNITY): Payer: Self-pay

## 2022-07-15 MED ORDER — ACYCLOVIR 400 MG PO TABS
ORAL_TABLET | ORAL | 0 refills | Status: DC
Start: 2022-07-15 — End: 2023-08-24
  Filled 2022-07-15: qty 90, 30d supply, fill #0

## 2022-07-21 ENCOUNTER — Other Ambulatory Visit (HOSPITAL_COMMUNITY): Payer: Self-pay

## 2022-07-22 ENCOUNTER — Other Ambulatory Visit (HOSPITAL_COMMUNITY): Payer: Self-pay

## 2022-07-22 MED ORDER — ALPRAZOLAM 0.5 MG PO TABS
0.5000 mg | ORAL_TABLET | Freq: Three times a day (TID) | ORAL | 0 refills | Status: DC
  Filled 2022-07-22: qty 90, 30d supply, fill #0

## 2022-07-28 ENCOUNTER — Other Ambulatory Visit (HOSPITAL_COMMUNITY): Payer: Self-pay

## 2022-08-03 ENCOUNTER — Other Ambulatory Visit (HOSPITAL_COMMUNITY): Payer: Self-pay

## 2022-08-11 ENCOUNTER — Encounter: Payer: Self-pay | Admitting: Adult Health

## 2022-08-11 ENCOUNTER — Ambulatory Visit: Payer: 59 | Admitting: Adult Health

## 2022-08-11 VITALS — BP 127/79 | HR 95 | Ht 62.0 in | Wt 116.5 lb

## 2022-08-11 DIAGNOSIS — Z319 Encounter for procreative management, unspecified: Secondary | ICD-10-CM

## 2022-08-11 NOTE — Progress Notes (Addendum)
  Subjective:     Patient ID: Haley Morales, female   DOB: 24-Jan-1991, 31 y.o.   MRN: [id number]  HPI Haley Morales is a 31 year old white female, married, G1P0101, in to discuss getting pregnant. Her last child was born at 63 weeks, PPROM. Her husband had a vasectomy reversal 2 weeks ago. She is tracking ovulation and taking PNV and weaning off xanax. She stopped OCs 2 months ago.   PCP is Tawni Carnes PA Last pap was 02/12/21, negative malignancy and HPV  Review of Systems Desires pregnancy Periods regular    Reviewed past medical,surgical, social and family history. Reviewed medications and allergies.  Objective:   Physical Exam BP 127/79 (BP Location: Left Arm, Patient Position: Sitting, Cuff Size: Normal)   Pulse 95   Ht 5\' 2"  (1.575 m)   Wt 116 lb 8 oz (52.8 kg)   LMP 07/25/2022   BMI 21.31 kg/m     Skin warm and dry. Lungs: clear to ausculation bilaterally. Cardiovascular: regular rate and rhythm.  Fall risk is low  Upstream - 08/11/22 1359       Pregnancy Intention Screening   Does the patient want to become pregnant in the next year? Yes    Does the patient's partner want to become pregnant in the next year? Yes    Would the patient like to discuss contraceptive options today? No      Contraception Wrap Up   Current Method Pregnant/Seeking Pregnancy    End Method Pregnant/Seeking Pregnancy             Assessment:     1. Patient desires pregnancy Take PNV Do ovulation predictor Have sex every other day, day 7-24 of cycle, pee before sex and lay there after She is aware can take 6-18 months of active trying Will check progesterone level in 3 months   Continue to wean off xanax Husband has SA in December  Pt aware that 17P no longer used and that she may not have another preterm delivery, but she could, not something you can predict.   Plan:     Follow up prn

## 2022-08-18 ENCOUNTER — Other Ambulatory Visit (HOSPITAL_COMMUNITY): Payer: Self-pay

## 2022-08-18 MED ORDER — BUPROPION HCL ER (XL) 150 MG PO TB24
150.0000 mg | ORAL_TABLET | Freq: Every day | ORAL | 0 refills | Status: DC
  Filled 2022-08-18: qty 90, 90d supply, fill #0

## 2022-08-18 MED ORDER — TRAZODONE HCL 50 MG PO TABS
100.0000 mg | ORAL_TABLET | Freq: Every evening | ORAL | 0 refills | Status: DC
  Filled 2022-08-18: qty 60, 30d supply, fill #0

## 2022-08-31 ENCOUNTER — Other Ambulatory Visit (HOSPITAL_COMMUNITY): Payer: Self-pay

## 2022-08-31 MED ORDER — BUPROPION HCL ER (XL) 150 MG PO TB24
150.0000 mg | ORAL_TABLET | Freq: Two times a day (BID) | ORAL | 0 refills | Status: DC
  Filled 2022-08-31 – 2022-09-23 (×7): qty 60, 30d supply, fill #0

## 2022-09-01 ENCOUNTER — Other Ambulatory Visit (HOSPITAL_COMMUNITY): Payer: Self-pay

## 2022-09-05 ENCOUNTER — Other Ambulatory Visit (HOSPITAL_COMMUNITY): Payer: Self-pay

## 2022-09-06 ENCOUNTER — Other Ambulatory Visit (HOSPITAL_COMMUNITY): Payer: Self-pay

## 2022-09-08 ENCOUNTER — Other Ambulatory Visit (HOSPITAL_COMMUNITY): Payer: Self-pay

## 2022-09-09 ENCOUNTER — Other Ambulatory Visit (HOSPITAL_COMMUNITY): Payer: Self-pay

## 2022-09-09 MED ORDER — HYDROXYZINE PAMOATE 25 MG PO CAPS
25.0000 mg | ORAL_CAPSULE | Freq: Every evening | ORAL | 0 refills | Status: DC
  Filled 2022-09-09: qty 90, 90d supply, fill #0

## 2022-09-13 ENCOUNTER — Other Ambulatory Visit (HOSPITAL_COMMUNITY): Payer: Self-pay

## 2022-09-23 ENCOUNTER — Other Ambulatory Visit (HOSPITAL_COMMUNITY): Payer: Self-pay

## 2022-09-23 MED ORDER — ALPRAZOLAM 0.5 MG PO TABS
0.5000 mg | ORAL_TABLET | Freq: Every day | ORAL | 0 refills | Status: DC
  Filled 2022-11-09: qty 30, 30d supply, fill #0

## 2022-09-23 MED ORDER — ALPRAZOLAM 0.5 MG PO TABS
ORAL_TABLET | ORAL | 0 refills | Status: DC
  Filled 2022-09-23: qty 30, 30d supply, fill #0

## 2022-09-23 MED ORDER — ALPRAZOLAM 0.5 MG PO TABS
0.5000 mg | ORAL_TABLET | Freq: Every day | ORAL | 0 refills | Status: DC
  Filled 2022-12-15: qty 30, 30d supply, fill #0

## 2022-10-22 ENCOUNTER — Other Ambulatory Visit (HOSPITAL_COMMUNITY): Payer: Self-pay

## 2022-10-22 MED ORDER — BUPROPION HCL ER (XL) 150 MG PO TB24
150.0000 mg | ORAL_TABLET | Freq: Two times a day (BID) | ORAL | 0 refills | Status: DC
  Filled 2022-10-22: qty 60, 30d supply, fill #0

## 2022-10-24 ENCOUNTER — Other Ambulatory Visit (HOSPITAL_COMMUNITY): Payer: Self-pay

## 2022-10-29 ENCOUNTER — Other Ambulatory Visit (HOSPITAL_COMMUNITY): Payer: Self-pay

## 2022-11-09 ENCOUNTER — Other Ambulatory Visit: Payer: Self-pay

## 2022-11-09 ENCOUNTER — Other Ambulatory Visit (HOSPITAL_COMMUNITY): Payer: Self-pay

## 2022-11-15 ENCOUNTER — Other Ambulatory Visit (HOSPITAL_COMMUNITY): Payer: Self-pay

## 2022-11-15 ENCOUNTER — Encounter (HOSPITAL_COMMUNITY): Payer: Self-pay

## 2022-11-18 ENCOUNTER — Other Ambulatory Visit (HOSPITAL_COMMUNITY): Payer: Self-pay

## 2022-11-18 MED ORDER — VALACYCLOVIR HCL 1 G PO TABS
2.0000 g | ORAL_TABLET | Freq: Two times a day (BID) | ORAL | 0 refills | Status: DC
  Filled 2022-11-18 – 2022-11-21 (×2): qty 4, 1d supply, fill #0

## 2022-11-18 MED ORDER — BUPROPION HCL ER (XL) 150 MG PO TB24
150.0000 mg | ORAL_TABLET | Freq: Two times a day (BID) | ORAL | 0 refills | Status: DC
  Filled 2022-11-18 – 2022-11-21 (×2): qty 180, 90d supply, fill #0

## 2022-11-18 MED ORDER — TRETINOIN MICROSPHERE 0.1 % EX GEL
Freq: Every evening | CUTANEOUS | 1 refills | Status: AC
  Filled 2022-11-18 – 2022-11-21 (×2): qty 45, 30d supply, fill #0

## 2022-11-21 ENCOUNTER — Other Ambulatory Visit (HOSPITAL_COMMUNITY): Payer: Self-pay

## 2022-11-21 ENCOUNTER — Other Ambulatory Visit: Payer: Self-pay

## 2022-11-22 ENCOUNTER — Encounter (HOSPITAL_COMMUNITY): Payer: Self-pay

## 2022-11-22 ENCOUNTER — Other Ambulatory Visit: Payer: Self-pay

## 2022-11-22 ENCOUNTER — Other Ambulatory Visit (HOSPITAL_COMMUNITY): Payer: Self-pay

## 2022-12-01 ENCOUNTER — Other Ambulatory Visit: Payer: Self-pay | Admitting: Adult Health

## 2022-12-01 ENCOUNTER — Other Ambulatory Visit (HOSPITAL_COMMUNITY): Payer: Self-pay

## 2022-12-01 MED ORDER — METRONIDAZOLE 500 MG PO TABS: 500.0000 mg | ORAL_TABLET | Freq: Two times a day (BID) | ORAL | 0 refills | Status: DC

## 2022-12-01 MED ORDER — METRONIDAZOLE 500 MG PO TABS
500.0000 mg | ORAL_TABLET | Freq: Two times a day (BID) | ORAL | 0 refills | Status: DC
  Filled 2022-12-01: qty 14, 7d supply, fill #0

## 2022-12-01 NOTE — Progress Notes (Signed)
Sent rx for flagyl to [facility]

## 2022-12-01 NOTE — Progress Notes (Signed)
Rx sent for flagyl  

## 2022-12-02 ENCOUNTER — Encounter: Payer: Self-pay | Admitting: *Deleted

## 2022-12-15 ENCOUNTER — Other Ambulatory Visit: Payer: Self-pay

## 2023-01-04 ENCOUNTER — Ambulatory Visit (HOSPITAL_BASED_OUTPATIENT_CLINIC_OR_DEPARTMENT_OTHER): Payer: Commercial Managed Care - PPO | Admitting: Cardiology

## 2023-02-11 ENCOUNTER — Other Ambulatory Visit (HOSPITAL_COMMUNITY): Payer: Self-pay

## 2023-02-14 ENCOUNTER — Other Ambulatory Visit (HOSPITAL_COMMUNITY): Payer: Self-pay

## 2023-02-14 MED ORDER — VALACYCLOVIR HCL 1 G PO TABS
2.0000 g | ORAL_TABLET | Freq: Two times a day (BID) | ORAL | 0 refills | Status: DC
  Filled 2023-02-14: qty 4, 1d supply, fill #0

## 2023-03-01 ENCOUNTER — Other Ambulatory Visit (HOSPITAL_COMMUNITY): Payer: Self-pay

## 2023-03-01 MED ORDER — LO LOESTRIN FE 1 MG-10 MCG / 10 MCG PO TABS
1.0000 | ORAL_TABLET | Freq: Every day | ORAL | 0 refills | Status: DC
  Filled 2023-03-01: qty 84, 84d supply, fill #0

## 2023-03-01 MED ORDER — NURTEC 75 MG PO TBDP
75.0000 mg | ORAL_TABLET | Freq: Every day | ORAL | 0 refills | Status: DC
  Filled 2023-03-01: qty 8, 15d supply, fill #0
  Filled 2023-05-10: qty 8, 15d supply, fill #1

## 2023-03-01 MED ORDER — ALPRAZOLAM 0.5 MG PO TABS
0.5000 mg | ORAL_TABLET | Freq: Every day | ORAL | 0 refills | Status: DC
  Filled 2023-05-10 – 2023-05-15 (×2): qty 30, 30d supply, fill #0

## 2023-03-01 MED ORDER — ALPRAZOLAM 0.5 MG PO TABS
0.5000 mg | ORAL_TABLET | Freq: Every day | ORAL | 0 refills | Status: DC
  Filled 2023-04-17: qty 30, 30d supply, fill #0

## 2023-03-01 MED ORDER — ALPRAZOLAM 0.5 MG PO TABS
0.5000 mg | ORAL_TABLET | Freq: Every day | ORAL | 0 refills | Status: DC
  Filled 2023-03-01: qty 30, 30d supply, fill #0

## 2023-03-01 MED ORDER — BUPROPION HCL ER (XL) 150 MG PO TB24
150.0000 mg | ORAL_TABLET | Freq: Two times a day (BID) | ORAL | 0 refills | Status: DC
  Filled 2023-03-01: qty 60, 30d supply, fill #0

## 2023-03-01 MED ORDER — VALACYCLOVIR HCL 1 G PO TABS
2000.0000 mg | ORAL_TABLET | Freq: Two times a day (BID) | ORAL | 0 refills | Status: DC
  Filled 2023-03-01: qty 4, 1d supply, fill #0

## 2023-03-02 ENCOUNTER — Other Ambulatory Visit: Payer: Self-pay

## 2023-03-02 ENCOUNTER — Other Ambulatory Visit (HOSPITAL_COMMUNITY): Payer: Self-pay

## 2023-03-23 ENCOUNTER — Encounter: Payer: Self-pay | Admitting: *Deleted

## 2023-03-24 ENCOUNTER — Encounter: Payer: Self-pay | Admitting: Adult Health

## 2023-03-24 ENCOUNTER — Ambulatory Visit (INDEPENDENT_AMBULATORY_CARE_PROVIDER_SITE_OTHER): Payer: Commercial Managed Care - PPO | Admitting: Adult Health

## 2023-03-24 VITALS — BP 115/77 | HR 79 | Ht 62.0 in | Wt 121.0 lb

## 2023-03-24 DIAGNOSIS — Z01419 Encounter for gynecological examination (general) (routine) without abnormal findings: Secondary | ICD-10-CM | POA: Diagnosis not present

## 2023-03-24 DIAGNOSIS — Z319 Encounter for procreative management, unspecified: Secondary | ICD-10-CM

## 2023-03-24 NOTE — Progress Notes (Signed)
Patient ID: Haley Morales, female   DOB: 02-Mar-1991, 32 y.o.   MRN: [id number] History of Present Illness: Haley Morales is a 32 year old white female, separated, G1P0101, in for well woman gyn exam.   Last pap was negative HPV and NILM 02/12/21.   PCP is Roma Kayser PA  Current Medications, Allergies, Past Medical History, Past Surgical History, Family History and Social History were reviewed in [employer] record.     Review of Systems: Patient denies any headaches, hearing loss, fatigue, blurred vision, shortness of breath, chest pain, abdominal pain, problems with bowel movements, urination, or intercourse. No joint pain or mood swings. Periods are regular.    Physical Exam:BP 115/77 (BP Location: Left Arm, Patient Position: Sitting, Cuff Size: Normal)   Pulse 79   Ht 5\' 2"  (1.575 m)   Wt 121 lb (54.9 kg)   LMP 03/05/2023   BMI 22.13 kg/m   General:  Well developed, well nourished, no acute distress Skin:  Warm and dry Neck:  Midline trachea, normal thyroid, good ROM, no lymphadenopathy Lungs; Clear to auscultation bilaterally Breast:  No dominant palpable mass, retraction, or nipple discharge,has bilateral implants  Cardiovascular: Regular rate and rhythm Abdomen:  Soft, non tender, no hepatosplenomegaly Pelvic:  External genitalia is normal in appearance, no lesions.  The vagina is normal in appearance. Urethra has no lesions or masses. The cervix is smooth.  Uterus is felt to be normal size, shape, and contour.  No adnexal masses or tenderness noted.Bladder is non tender, no masses felt. Extremities/musculoskeletal:  No swelling or varicosities noted, no clubbing or cyanosis Psych:  No mood changes, alert and cooperative,seems happy AA is 0 Fall risk is low    03/24/2023   10:33 AM 03/14/2022   10:16 AM 02/12/2021   10:17 AM  Depression screen PHQ 2/9  Decreased Interest 1 1 1   Down, Depressed, Hopeless 1 0 1  PHQ - 2 Score 2 1 2   Altered sleeping  1 0 1  Tired, decreased energy 0 0 1  Change in appetite 0 1 1  Feeling bad or failure about yourself  0 0 1  Trouble concentrating 0 0 0  Moving slowly or fidgety/restless 0 0 0  Suicidal thoughts 0 0 0  PHQ-9 Score 3 2 6    She is on meds     03/24/2023   10:33 AM 03/14/2022   10:17 AM 02/12/2021   10:17 AM  GAD 7 : Generalized Anxiety Score  Nervous, Anxious, on Edge 1 1 1   Control/stop worrying 1 1 1   Worry too much - different things 1 1 1   Trouble relaxing 1 0 1  Restless 0 0 1  Easily annoyed or irritable 0 0 2  Afraid - awful might happen 0 0 1  Total GAD 7 Score 4 3 8       Upstream - 03/24/23 1039       Pregnancy Intention Screening   Does the patient want to become pregnant in the next year? No    Does the patient's partner want to become pregnant in the next year? No    Would the patient like to discuss contraceptive options today? No      Contraception Wrap Up   Current Method Female Condom    End Method Female Condom            Examination chaperoned by Malachy Mood LPN   Impression and plan: 1. Patient desires pregnancy She wants to see if ovulated, would  like to get pregnant in future - Progesterone  2. Encounter for annual routine gynecological examination Pap and physical in 1 year Declines labs Follow up with PCP meds Stay active

## 2023-03-25 LAB — PROGESTERONE: Progesterone: 13.3 ng/mL

## 2023-04-14 ENCOUNTER — Other Ambulatory Visit (HOSPITAL_COMMUNITY): Payer: Self-pay

## 2023-04-15 ENCOUNTER — Other Ambulatory Visit (HOSPITAL_BASED_OUTPATIENT_CLINIC_OR_DEPARTMENT_OTHER): Payer: Self-pay

## 2023-04-17 ENCOUNTER — Other Ambulatory Visit: Payer: Self-pay

## 2023-04-17 ENCOUNTER — Other Ambulatory Visit (HOSPITAL_COMMUNITY): Payer: Self-pay

## 2023-04-17 MED ORDER — BUPROPION HCL ER (XL) 150 MG PO TB24
150.0000 mg | ORAL_TABLET | Freq: Two times a day (BID) | ORAL | 0 refills | Status: DC
  Filled 2023-04-17: qty 60, 30d supply, fill #0

## 2023-04-19 ENCOUNTER — Other Ambulatory Visit (HOSPITAL_COMMUNITY): Payer: Self-pay

## 2023-05-10 ENCOUNTER — Other Ambulatory Visit (HOSPITAL_COMMUNITY): Payer: Self-pay

## 2023-05-11 ENCOUNTER — Other Ambulatory Visit (HOSPITAL_COMMUNITY): Payer: Self-pay

## 2023-05-11 ENCOUNTER — Other Ambulatory Visit: Payer: Self-pay

## 2023-05-11 MED ORDER — RIMEGEPANT SULFATE 75 MG PO TBDP
75.0000 mg | ORAL_TABLET | Freq: Every day | ORAL | 0 refills | Status: DC
  Filled 2023-05-11: qty 9, 17d supply, fill #0

## 2023-05-11 MED ORDER — BUPROPION HCL ER (XL) 150 MG PO TB24
150.0000 mg | ORAL_TABLET | Freq: Two times a day (BID) | ORAL | 0 refills | Status: DC
  Filled 2023-05-11: qty 60, 30d supply, fill #0

## 2023-05-15 ENCOUNTER — Other Ambulatory Visit: Payer: Self-pay

## 2023-05-24 ENCOUNTER — Other Ambulatory Visit (HOSPITAL_COMMUNITY): Payer: Self-pay

## 2023-05-30 ENCOUNTER — Other Ambulatory Visit (HOSPITAL_COMMUNITY): Payer: Self-pay

## 2023-05-30 ENCOUNTER — Other Ambulatory Visit: Payer: Self-pay

## 2023-05-30 MED ORDER — LO LOESTRIN FE 1 MG-10 MCG / 10 MCG PO TABS
1.0000 | ORAL_TABLET | Freq: Every day | ORAL | 0 refills | Status: DC
  Filled 2023-05-30: qty 84, 84d supply, fill #0

## 2023-05-30 MED ORDER — NURTEC 75 MG PO TBDP
75.0000 mg | ORAL_TABLET | Freq: Every day | ORAL | 0 refills | Status: DC
  Filled 2023-05-30: qty 9, 18d supply, fill #0

## 2023-05-30 MED ORDER — ALPRAZOLAM 0.5 MG PO TABS
0.5000 mg | ORAL_TABLET | Freq: Every day | ORAL | 0 refills | Status: DC
  Filled 2023-11-08: qty 30, 30d supply, fill #0

## 2023-05-30 MED ORDER — ALPRAZOLAM 0.5 MG PO TABS
0.5000 mg | ORAL_TABLET | Freq: Every day | ORAL | 0 refills | Status: DC
  Filled 2023-09-20: qty 30, 30d supply, fill #0

## 2023-05-30 MED ORDER — ALPRAZOLAM 0.5 MG PO TABS
0.5000 mg | ORAL_TABLET | Freq: Every day | ORAL | 0 refills | Status: DC
  Filled 2023-06-09 – 2023-08-24 (×2): qty 30, 30d supply, fill #0

## 2023-05-31 ENCOUNTER — Other Ambulatory Visit (HOSPITAL_COMMUNITY): Payer: Self-pay

## 2023-06-09 ENCOUNTER — Other Ambulatory Visit: Payer: Self-pay

## 2023-06-22 ENCOUNTER — Other Ambulatory Visit: Payer: Self-pay | Admitting: Oncology

## 2023-06-22 DIAGNOSIS — Z006 Encounter for examination for normal comparison and control in clinical research program: Secondary | ICD-10-CM

## 2023-07-05 ENCOUNTER — Ambulatory Visit: Payer: Commercial Managed Care - PPO | Admitting: Adult Health

## 2023-07-25 DIAGNOSIS — J069 Acute upper respiratory infection, unspecified: Secondary | ICD-10-CM | POA: Diagnosis not present

## 2023-07-25 DIAGNOSIS — Z6822 Body mass index (BMI) 22.0-22.9, adult: Secondary | ICD-10-CM | POA: Diagnosis not present

## 2023-07-25 DIAGNOSIS — Z20828 Contact with and (suspected) exposure to other viral communicable diseases: Secondary | ICD-10-CM | POA: Diagnosis not present

## 2023-07-25 DIAGNOSIS — J029 Acute pharyngitis, unspecified: Secondary | ICD-10-CM | POA: Diagnosis not present

## 2023-07-26 ENCOUNTER — Other Ambulatory Visit (HOSPITAL_COMMUNITY): Payer: Self-pay

## 2023-07-26 MED ORDER — FLUTICASONE PROPIONATE 50 MCG/ACT NA SUSP
2.0000 | Freq: Every day | NASAL | 0 refills | Status: DC
  Filled 2023-07-26: qty 16, 30d supply, fill #0

## 2023-08-08 ENCOUNTER — Other Ambulatory Visit (HOSPITAL_COMMUNITY): Payer: Self-pay

## 2023-08-08 MED ORDER — BUPROPION HCL ER (XL) 150 MG PO TB24
150.0000 mg | ORAL_TABLET | Freq: Two times a day (BID) | ORAL | 3 refills | Status: AC
  Filled 2023-08-08: qty 60, 30d supply, fill #0
  Filled 2023-09-04: qty 60, 30d supply, fill #1

## 2023-08-08 MED ORDER — VALACYCLOVIR HCL 1 G PO TABS
2000.0000 mg | ORAL_TABLET | Freq: Two times a day (BID) | ORAL | 0 refills | Status: DC
  Filled 2023-08-08: qty 4, 1d supply, fill #0

## 2023-08-09 ENCOUNTER — Other Ambulatory Visit (HOSPITAL_COMMUNITY): Payer: Self-pay

## 2023-08-09 ENCOUNTER — Other Ambulatory Visit: Payer: Self-pay

## 2023-08-24 ENCOUNTER — Other Ambulatory Visit (HOSPITAL_COMMUNITY): Payer: Self-pay

## 2023-08-24 ENCOUNTER — Other Ambulatory Visit: Payer: Self-pay

## 2023-08-24 MED ORDER — RIMEGEPANT SULFATE 75 MG PO TBDP
75.0000 mg | ORAL_TABLET | Freq: Every day | ORAL | 2 refills | Status: AC
  Filled 2023-08-24: qty 8, 30d supply, fill #0
  Filled 2023-09-20: qty 8, 30d supply, fill #1
  Filled 2024-06-03: qty 8, 30d supply, fill #0
  Filled 2024-07-17: qty 3, 26d supply, fill #1

## 2023-08-24 MED ORDER — ACYCLOVIR 400 MG PO TABS
400.0000 mg | ORAL_TABLET | Freq: Three times a day (TID) | ORAL | 0 refills | Status: AC
  Filled 2023-08-24: qty 90, 30d supply, fill #0

## 2023-08-25 ENCOUNTER — Other Ambulatory Visit: Payer: Self-pay

## 2023-09-04 ENCOUNTER — Other Ambulatory Visit (HOSPITAL_COMMUNITY): Payer: Self-pay

## 2023-09-19 ENCOUNTER — Other Ambulatory Visit (HOSPITAL_COMMUNITY): Payer: Self-pay

## 2023-09-20 ENCOUNTER — Other Ambulatory Visit (HOSPITAL_COMMUNITY): Payer: Self-pay

## 2023-09-21 ENCOUNTER — Other Ambulatory Visit: Payer: Self-pay

## 2023-09-21 ENCOUNTER — Other Ambulatory Visit (HOSPITAL_COMMUNITY): Payer: Self-pay

## 2023-10-02 ENCOUNTER — Other Ambulatory Visit (HOSPITAL_COMMUNITY): Payer: Self-pay

## 2023-10-02 ENCOUNTER — Other Ambulatory Visit: Payer: Self-pay

## 2023-10-02 DIAGNOSIS — R000 Tachycardia, unspecified: Secondary | ICD-10-CM | POA: Diagnosis not present

## 2023-10-02 DIAGNOSIS — R519 Headache, unspecified: Secondary | ICD-10-CM | POA: Diagnosis not present

## 2023-10-02 DIAGNOSIS — N926 Irregular menstruation, unspecified: Secondary | ICD-10-CM | POA: Diagnosis not present

## 2023-10-02 DIAGNOSIS — R030 Elevated blood-pressure reading, without diagnosis of hypertension: Secondary | ICD-10-CM | POA: Diagnosis not present

## 2023-10-02 DIAGNOSIS — F419 Anxiety disorder, unspecified: Secondary | ICD-10-CM | POA: Diagnosis not present

## 2023-10-02 MED ORDER — ALPRAZOLAM 0.5 MG PO TABS: 0.5000 mg | ORAL_TABLET | Freq: Every day | ORAL | 0 refills | Status: DC

## 2023-10-02 MED ORDER — BUPROPION HCL ER (XL) 150 MG PO TB24
150.0000 mg | ORAL_TABLET | Freq: Two times a day (BID) | ORAL | 1 refills | Status: AC
  Filled 2023-10-02: qty 180, 90d supply, fill #0

## 2023-10-02 MED ORDER — ALPRAZOLAM 0.5 MG PO TABS
0.5000 mg | ORAL_TABLET | Freq: Every day | ORAL | 0 refills | Status: DC
  Filled 2023-10-02 – 2023-12-29 (×2): qty 30, 30d supply, fill #0

## 2023-10-02 MED ORDER — VALACYCLOVIR HCL 1 G PO TABS
2000.0000 mg | ORAL_TABLET | Freq: Two times a day (BID) | ORAL | 0 refills | Status: AC
  Filled 2023-10-02: qty 4, 1d supply, fill #0

## 2023-10-02 MED ORDER — NURTEC 75 MG PO TBDP
75.0000 mg | ORAL_TABLET | Freq: Every day | ORAL | 2 refills | Status: DC
  Filled 2023-10-02: qty 9, 30d supply, fill #0
  Filled 2023-11-08: qty 8, 30d supply, fill #0
  Filled 2024-01-29: qty 8, 30d supply, fill #1
  Filled 2024-02-23: qty 8, 30d supply, fill #2
  Filled 2024-06-02 – 2024-07-17 (×2): qty 8, 30d supply, fill #3
  Filled 2024-08-06: qty 3, 26d supply, fill #3

## 2023-10-16 ENCOUNTER — Other Ambulatory Visit: Payer: Self-pay | Admitting: Adult Health

## 2023-10-16 DIAGNOSIS — Z319 Encounter for procreative management, unspecified: Secondary | ICD-10-CM

## 2023-10-17 DIAGNOSIS — Z319 Encounter for procreative management, unspecified: Secondary | ICD-10-CM | POA: Diagnosis not present

## 2023-10-18 ENCOUNTER — Other Ambulatory Visit: Payer: Self-pay | Admitting: Adult Health

## 2023-10-18 DIAGNOSIS — Z319 Encounter for procreative management, unspecified: Secondary | ICD-10-CM

## 2023-10-18 LAB — PROGESTERONE: Progesterone: 6.7 ng/mL

## 2023-10-18 NOTE — Progress Notes (Signed)
Will recheck progesterone level 12/6

## 2023-10-20 DIAGNOSIS — Z319 Encounter for procreative management, unspecified: Secondary | ICD-10-CM | POA: Diagnosis not present

## 2023-10-21 LAB — PROGESTERONE: Progesterone: 6.8 ng/mL

## 2023-10-23 ENCOUNTER — Other Ambulatory Visit (HOSPITAL_COMMUNITY): Payer: Self-pay

## 2023-10-23 ENCOUNTER — Other Ambulatory Visit: Payer: Self-pay | Admitting: Adult Health

## 2023-10-23 MED ORDER — CLOMIPHENE CITRATE 50 MG PO TABS
50.0000 mg | ORAL_TABLET | Freq: Every day | ORAL | 2 refills | Status: DC
  Filled 2023-10-23 – 2023-10-24 (×2): qty 5, 5d supply, fill #0
  Filled 2023-11-26 – 2023-11-27 (×4): qty 5, 5d supply, fill #1
  Filled 2023-12-26: qty 5, 5d supply, fill #2

## 2023-10-23 NOTE — Progress Notes (Signed)
Rx clomid 

## 2023-10-24 ENCOUNTER — Encounter (HOSPITAL_COMMUNITY): Payer: Self-pay

## 2023-10-24 ENCOUNTER — Other Ambulatory Visit (HOSPITAL_COMMUNITY): Payer: Self-pay

## 2023-10-24 ENCOUNTER — Other Ambulatory Visit: Payer: Self-pay

## 2023-10-30 ENCOUNTER — Other Ambulatory Visit: Payer: Self-pay | Admitting: Adult Health

## 2023-10-30 DIAGNOSIS — Z319 Encounter for procreative management, unspecified: Secondary | ICD-10-CM

## 2023-10-30 NOTE — Progress Notes (Signed)
Ck progesterone level 11/17/23.

## 2023-11-08 ENCOUNTER — Other Ambulatory Visit (HOSPITAL_COMMUNITY): Payer: Self-pay

## 2023-11-09 ENCOUNTER — Other Ambulatory Visit: Payer: Self-pay

## 2023-11-17 DIAGNOSIS — Z319 Encounter for procreative management, unspecified: Secondary | ICD-10-CM | POA: Diagnosis not present

## 2023-11-20 LAB — PROGESTERONE: Progesterone: 77.5 ng/mL

## 2023-11-27 ENCOUNTER — Other Ambulatory Visit: Payer: Self-pay

## 2023-11-27 ENCOUNTER — Other Ambulatory Visit (HOSPITAL_COMMUNITY): Payer: Self-pay

## 2023-11-28 ENCOUNTER — Other Ambulatory Visit: Payer: Self-pay

## 2023-11-29 ENCOUNTER — Other Ambulatory Visit (HOSPITAL_COMMUNITY): Payer: Self-pay

## 2023-11-29 ENCOUNTER — Other Ambulatory Visit: Payer: Self-pay | Admitting: Adult Health

## 2023-11-29 DIAGNOSIS — Z319 Encounter for procreative management, unspecified: Secondary | ICD-10-CM

## 2023-11-29 NOTE — Progress Notes (Signed)
 Ck progesterone  level 12/18/23

## 2023-11-30 ENCOUNTER — Other Ambulatory Visit (HOSPITAL_COMMUNITY): Payer: Self-pay

## 2023-12-20 DIAGNOSIS — Z319 Encounter for procreative management, unspecified: Secondary | ICD-10-CM | POA: Diagnosis not present

## 2023-12-22 LAB — PROGESTERONE: Progesterone: 54.2 ng/mL

## 2023-12-27 ENCOUNTER — Other Ambulatory Visit: Payer: Self-pay

## 2023-12-27 ENCOUNTER — Other Ambulatory Visit (HOSPITAL_COMMUNITY): Payer: Self-pay

## 2023-12-30 ENCOUNTER — Other Ambulatory Visit (HOSPITAL_COMMUNITY): Payer: Self-pay

## 2023-12-30 ENCOUNTER — Other Ambulatory Visit (HOSPITAL_BASED_OUTPATIENT_CLINIC_OR_DEPARTMENT_OTHER): Payer: Self-pay

## 2024-01-02 ENCOUNTER — Other Ambulatory Visit: Payer: Self-pay | Admitting: Adult Health

## 2024-01-02 DIAGNOSIS — Z319 Encounter for procreative management, unspecified: Secondary | ICD-10-CM

## 2024-01-02 NOTE — Progress Notes (Signed)
 Ck progesterone level March 7 order is in

## 2024-01-19 ENCOUNTER — Other Ambulatory Visit (HOSPITAL_COMMUNITY)
Admission: RE | Admit: 2024-01-19 | Discharge: 2024-01-19 | Disposition: A | Discharge status: Home / Self Care | Source: Ambulatory Visit | Attending: Adult Health | Admitting: Adult Health

## 2024-01-19 DIAGNOSIS — Z006 Encounter for examination for normal comparison and control in clinical research program: Secondary | ICD-10-CM | POA: Insufficient documentation

## 2024-01-24 ENCOUNTER — Other Ambulatory Visit (HOSPITAL_COMMUNITY)
Admission: RE | Admit: 2024-01-24 | Discharge: 2024-01-24 | Disposition: A | Discharge status: Home / Self Care | Attending: Adult Health | Admitting: Adult Health

## 2024-01-24 DIAGNOSIS — Z319 Encounter for procreative management, unspecified: Secondary | ICD-10-CM | POA: Insufficient documentation

## 2024-01-25 LAB — PROGESTERONE: Progesterone: 22.6 ng/mL

## 2024-01-29 ENCOUNTER — Other Ambulatory Visit (HOSPITAL_COMMUNITY): Payer: Self-pay

## 2024-01-31 ENCOUNTER — Other Ambulatory Visit: Payer: Self-pay | Admitting: Adult Health

## 2024-01-31 ENCOUNTER — Other Ambulatory Visit (HOSPITAL_COMMUNITY): Payer: Self-pay

## 2024-01-31 ENCOUNTER — Other Ambulatory Visit: Payer: Self-pay

## 2024-01-31 MED ORDER — CLOMIPHENE CITRATE 50 MG PO TABS
50.0000 mg | ORAL_TABLET | Freq: Every day | ORAL | 2 refills | Status: DC
  Filled 2024-01-31: qty 5, 5d supply, fill #0

## 2024-02-02 LAB — GENECONNECT MOLECULAR SCREEN: Genetic Analysis Overall Interpretation: NEGATIVE

## 2024-02-23 ENCOUNTER — Other Ambulatory Visit (HOSPITAL_COMMUNITY): Payer: Self-pay

## 2024-03-08 ENCOUNTER — Other Ambulatory Visit (HOSPITAL_COMMUNITY): Payer: Self-pay

## 2024-03-08 MED ORDER — BUPROPION HCL ER (XL) 150 MG PO TB24
150.0000 mg | ORAL_TABLET | Freq: Two times a day (BID) | ORAL | 1 refills | Status: AC
  Filled 2024-03-08 – 2024-08-06 (×3): qty 180, 90d supply, fill #0

## 2024-03-08 MED ORDER — ALPRAZOLAM 0.5 MG PO TABS
0.5000 mg | ORAL_TABLET | Freq: Every day | ORAL | 0 refills | Status: DC
  Filled 2024-03-08: qty 30, 30d supply, fill #0

## 2024-03-11 ENCOUNTER — Other Ambulatory Visit: Payer: Self-pay

## 2024-03-14 ENCOUNTER — Other Ambulatory Visit (HOSPITAL_COMMUNITY): Payer: Self-pay

## 2024-03-18 ENCOUNTER — Other Ambulatory Visit: Payer: Self-pay | Admitting: Adult Health

## 2024-03-18 DIAGNOSIS — N926 Irregular menstruation, unspecified: Secondary | ICD-10-CM

## 2024-03-18 NOTE — Progress Notes (Signed)
 Ck QHCG

## 2024-03-19 LAB — BETA HCG QUANT (REF LAB): hCG Quant: <1 m[IU]/mL

## 2024-03-28 ENCOUNTER — Ambulatory Visit (INDEPENDENT_AMBULATORY_CARE_PROVIDER_SITE_OTHER): Admitting: Adult Health

## 2024-03-28 ENCOUNTER — Other Ambulatory Visit (HOSPITAL_COMMUNITY)
Admission: RE | Admit: 2024-03-28 | Discharge: 2024-03-28 | Disposition: A | Discharge status: Home / Self Care | Source: Ambulatory Visit | Attending: Adult Health | Admitting: Adult Health

## 2024-03-28 ENCOUNTER — Encounter: Payer: Self-pay | Admitting: Adult Health

## 2024-03-28 VITALS — BP 123/81 | HR 83 | Ht 62.0 in | Wt 135.0 lb

## 2024-03-28 DIAGNOSIS — Z01419 Encounter for gynecological examination (general) (routine) without abnormal findings: Secondary | ICD-10-CM | POA: Insufficient documentation

## 2024-03-28 DIAGNOSIS — Z1329 Encounter for screening for other suspected endocrine disorder: Secondary | ICD-10-CM | POA: Diagnosis not present

## 2024-03-28 DIAGNOSIS — Z319 Encounter for procreative management, unspecified: Secondary | ICD-10-CM

## 2024-03-28 DIAGNOSIS — Z1331 Encounter for screening for depression: Secondary | ICD-10-CM | POA: Diagnosis not present

## 2024-03-28 DIAGNOSIS — E049 Nontoxic goiter, unspecified: Secondary | ICD-10-CM

## 2024-03-28 DIAGNOSIS — N926 Irregular menstruation, unspecified: Secondary | ICD-10-CM | POA: Diagnosis not present

## 2024-03-28 DIAGNOSIS — Z0000 Encounter for general adult medical examination without abnormal findings: Secondary | ICD-10-CM | POA: Diagnosis not present

## 2024-03-28 NOTE — Addendum Note (Signed)
 Addended by: Myrl Askew on: 03/28/2024 10:14 AM   Modules accepted: Orders

## 2024-03-28 NOTE — Progress Notes (Signed)
 Patient ID: Lesia Hellickson, female   DOB: 08/13/1991, 33 y.o.   MRN: [id number] History of Present Illness: Haley Morales is a 33 year old white female, engaged, G1P0101, in for a well woman gyn exam and pap. She had not had a period since 02/01/24 and had negative [facility] 03/18/24. She has been on clomid  and has ovulated, partner has not gotten SA.  PCP is Tisa Forester PA    Current Medications, Allergies, Past Medical History, Past Surgical History, Family History and Social History were reviewed in [employer] record.     Review of Systems: Patient denies any headaches, hearing loss, fatigue, blurred vision, shortness of breath, chest pain, abdominal pain, problems with bowel movements, urination, or intercourse. No joint pain or mood swings.  +missed period   Physical Exam:BP 123/81 (BP Location: Right Arm, Patient Position: Sitting, Cuff Size: Normal)   Pulse 83   Ht 5\' 2"  (1.575 m)   Wt 135 lb (61.2 kg)   LMP 02/01/2024   BMI 24.69 kg/m   General:  Well developed, well nourished, no acute distress Skin:  Warm and dry Neck:  Midline trachea, enlarged thyroid , good ROM, no lymphadenopathy Lungs; Clear to auscultation bilaterally Breast:  No dominant palpable mass, retraction, or nipple discharge Cardiovascular: Regular rate and rhythm Abdomen:  Soft, non tender, no hepatosplenomegaly Pelvic:  External genitalia is normal in appearance, no lesions.  The vagina is normal in appearance. Urethra has no lesions or masses. The cervix is smooth. Pap with HR HPV genotyping performed.  Uterus is felt to be normal size, shape, and contour.  No adnexal masses or tenderness noted.Bladder is non tender, no masses felt. Extremities/musculoskeletal:  No swelling or varicosities noted, no clubbing or cyanosis Psych:  No mood changes, alert and cooperative,seems happy AA is 0 Fall risk is low    03/28/2024    8:31 AM 03/24/2023   10:33 AM 03/14/2022   10:16 AM  Depression  screen PHQ 2/9  Decreased Interest 1 1 1   Down, Depressed, Hopeless 1 1 0  PHQ - 2 Score 2 2 1   Altered sleeping 0 1 0  Tired, decreased energy 1 0 0  Change in appetite 1 0 1  Feeling bad or failure about yourself  0 0 0  Trouble concentrating 0 0 0  Moving slowly or fidgety/restless 0 0 0  Suicidal thoughts 0 0 0  PHQ-9 Score 4 3 2        03/28/2024    8:31 AM 03/24/2023   10:33 AM 03/14/2022   10:17 AM 02/12/2021   10:17 AM  GAD 7 : Generalized Anxiety Score  Nervous, Anxious, on Edge 1 1 1 1   Control/stop worrying 1 1 1 1   Worry too much - different things 1 1 1 1   Trouble relaxing 0 1 0 1  Restless 0 0 0 1  Easily annoyed or irritable 0 0 0 2  Afraid - awful might happen 0 0 0 1  Total GAD 7 Score 3 4 3 8     Upstream - 03/28/24 0839       Pregnancy Intention Screening   Does the patient want to become pregnant in the next year? Yes    Does the patient's partner want to become pregnant in the next year? Yes    Would the patient like to discuss contraceptive options today? No      Contraception Wrap Up   Current Method Pregnant/Seeking Pregnancy    End Method Pregnant/Seeking Pregnancy  Contraception Counseling Provided No            Examination chaperoned by Tish RN    Impression and plan: 1. Encounter for routine gynecological examination with Papanicolaou smear of cervix (Primary) Pap sent Pap in 3 years if normal Physical in 1 year Stay active   2. Missed period Check QHCG again since no period still  - Beta hCG quant (ref lab)  3. Patient desires pregnancy Has missed period  Will recheck QHCG  - Beta hCG quant (ref lab) Get partner to get SA  4. Enlarged thyroid  Thyroid  feel enlarged will check labs and get thyroid  US  04/17/24 at 11:30 am at [facility]  - TSH + free T4 - US  THYROID ; Future  5. Screening for thyroid  disorder - TSH + free T4

## 2024-03-29 ENCOUNTER — Ambulatory Visit: Payer: Self-pay | Admitting: Adult Health

## 2024-03-29 DIAGNOSIS — R87612 Low grade squamous intraepithelial lesion on cytologic smear of cervix (LGSIL): Secondary | ICD-10-CM

## 2024-03-29 LAB — TSH+FREE T4
Free T4: 1.22 ng/dL (ref 0.82–1.77)
TSH: 0.463 u[IU]/mL (ref 0.450–4.500)

## 2024-03-29 LAB — BETA HCG QUANT (REF LAB): hCG Quant: <1 m[IU]/mL

## 2024-04-01 ENCOUNTER — Other Ambulatory Visit: Payer: Self-pay | Admitting: Adult Health

## 2024-04-01 DIAGNOSIS — R87612 Low grade squamous intraepithelial lesion on cytologic smear of cervix (LGSIL): Secondary | ICD-10-CM | POA: Insufficient documentation

## 2024-04-01 DIAGNOSIS — Z319 Encounter for procreative management, unspecified: Secondary | ICD-10-CM

## 2024-04-01 LAB — CYTOLOGY - PAP
Comment: NEGATIVE
High risk HPV: NEGATIVE

## 2024-04-01 NOTE — Progress Notes (Unsigned)
Ck progesterone level 

## 2024-04-09 DIAGNOSIS — Z319 Encounter for procreative management, unspecified: Secondary | ICD-10-CM | POA: Diagnosis not present

## 2024-04-10 ENCOUNTER — Ambulatory Visit: Payer: Self-pay | Admitting: Adult Health

## 2024-04-10 LAB — PROGESTERONE: Progesterone: 10.5 ng/mL

## 2024-04-16 ENCOUNTER — Other Ambulatory Visit: Payer: Self-pay | Admitting: Adult Health

## 2024-04-16 ENCOUNTER — Other Ambulatory Visit: Payer: Self-pay

## 2024-04-16 ENCOUNTER — Other Ambulatory Visit (HOSPITAL_COMMUNITY): Payer: Self-pay

## 2024-04-16 DIAGNOSIS — Z319 Encounter for procreative management, unspecified: Secondary | ICD-10-CM

## 2024-04-16 MED ORDER — LETROZOLE 2.5 MG PO TABS
2.5000 mg | ORAL_TABLET | Freq: Every day | ORAL | 2 refills | Status: AC
  Filled 2024-04-16 (×2): qty 5, 5d supply, fill #0
  Filled 2024-06-21 (×2): qty 5, 5d supply, fill #1
  Filled 2024-07-17 – 2024-08-06 (×2): qty 5, 5d supply, fill #2

## 2024-04-16 NOTE — Progress Notes (Signed)
 Ck progesterone  level 05/06/24

## 2024-04-16 NOTE — Progress Notes (Signed)
 Rx femara

## 2024-04-17 ENCOUNTER — Ambulatory Visit (HOSPITAL_COMMUNITY)
Admission: RE | Admit: 2024-04-17 | Discharge: 2024-04-17 | Disposition: A | Discharge status: Home / Self Care | Source: Ambulatory Visit | Attending: Adult Health | Admitting: Adult Health

## 2024-04-17 DIAGNOSIS — E049 Nontoxic goiter, unspecified: Secondary | ICD-10-CM | POA: Diagnosis not present

## 2024-04-17 DIAGNOSIS — E042 Nontoxic multinodular goiter: Secondary | ICD-10-CM | POA: Diagnosis not present

## 2024-05-06 ENCOUNTER — Other Ambulatory Visit (HOSPITAL_COMMUNITY)
Admission: RE | Admit: 2024-05-06 | Discharge: 2024-05-06 | Disposition: A | Discharge status: Home / Self Care | Source: Ambulatory Visit | Attending: Adult Health | Admitting: Adult Health

## 2024-05-06 DIAGNOSIS — Z319 Encounter for procreative management, unspecified: Secondary | ICD-10-CM | POA: Diagnosis not present

## 2024-05-07 LAB — PROGESTERONE: Progesterone: 14.1 ng/mL

## 2024-05-08 ENCOUNTER — Ambulatory Visit: Payer: Self-pay | Admitting: Adult Health

## 2024-05-14 ENCOUNTER — Other Ambulatory Visit (HOSPITAL_BASED_OUTPATIENT_CLINIC_OR_DEPARTMENT_OTHER): Payer: Self-pay

## 2024-05-15 ENCOUNTER — Other Ambulatory Visit: Payer: Self-pay | Admitting: Adult Health

## 2024-05-15 ENCOUNTER — Other Ambulatory Visit (HOSPITAL_COMMUNITY)
Admission: RE | Admit: 2024-05-15 | Discharge: 2024-05-15 | Disposition: A | Discharge status: Home / Self Care | Source: Ambulatory Visit | Attending: Adult Health | Admitting: Adult Health

## 2024-05-15 DIAGNOSIS — Z3201 Encounter for pregnancy test, result positive: Secondary | ICD-10-CM | POA: Diagnosis not present

## 2024-05-15 NOTE — Progress Notes (Signed)
 Ck QHCG

## 2024-05-16 ENCOUNTER — Other Ambulatory Visit: Payer: Self-pay | Admitting: *Deleted

## 2024-05-16 ENCOUNTER — Encounter: Payer: Self-pay | Admitting: Obstetrics & Gynecology

## 2024-05-16 DIAGNOSIS — Z3490 Encounter for supervision of normal pregnancy, unspecified, unspecified trimester: Secondary | ICD-10-CM

## 2024-05-16 LAB — BETA HCG QUANT (REF LAB): hCG Quant: 13 m[IU]/mL

## 2024-05-21 ENCOUNTER — Ambulatory Visit: Payer: Self-pay | Admitting: Adult Health

## 2024-05-21 ENCOUNTER — Other Ambulatory Visit (HOSPITAL_COMMUNITY)
Admission: RE | Admit: 2024-05-21 | Discharge: 2024-05-21 | Disposition: A | Discharge status: Home / Self Care | Source: Ambulatory Visit | Attending: Adult Health | Admitting: Adult Health

## 2024-05-21 DIAGNOSIS — Z3491 Encounter for supervision of normal pregnancy, unspecified, first trimester: Secondary | ICD-10-CM | POA: Insufficient documentation

## 2024-05-21 DIAGNOSIS — Z3A01 Less than 8 weeks gestation of pregnancy: Secondary | ICD-10-CM | POA: Diagnosis not present

## 2024-05-21 DIAGNOSIS — Z3490 Encounter for supervision of normal pregnancy, unspecified, unspecified trimester: Secondary | ICD-10-CM | POA: Insufficient documentation

## 2024-05-21 LAB — HCG, QUANTITATIVE, PREGNANCY: hCG, Beta Chain, Quant, S: 1 m[IU]/mL (ref <5)

## 2024-06-02 ENCOUNTER — Other Ambulatory Visit (HOSPITAL_BASED_OUTPATIENT_CLINIC_OR_DEPARTMENT_OTHER): Payer: Self-pay

## 2024-06-03 ENCOUNTER — Other Ambulatory Visit (HOSPITAL_BASED_OUTPATIENT_CLINIC_OR_DEPARTMENT_OTHER): Payer: Self-pay

## 2024-06-03 MED ORDER — ALPRAZOLAM 0.5 MG PO TABS
0.5000 mg | ORAL_TABLET | Freq: Every day | ORAL | 0 refills | Status: AC
  Filled 2024-06-03: qty 30, 30d supply, fill #0

## 2024-06-12 ENCOUNTER — Other Ambulatory Visit (HOSPITAL_BASED_OUTPATIENT_CLINIC_OR_DEPARTMENT_OTHER): Payer: Self-pay

## 2024-06-18 ENCOUNTER — Other Ambulatory Visit: Payer: Self-pay | Admitting: Adult Health

## 2024-06-18 DIAGNOSIS — N926 Irregular menstruation, unspecified: Secondary | ICD-10-CM

## 2024-06-18 NOTE — Progress Notes (Signed)
 Ck qhcg

## 2024-06-20 ENCOUNTER — Other Ambulatory Visit (HOSPITAL_COMMUNITY)
Admission: RE | Admit: 2024-06-20 | Discharge: 2024-06-20 | Disposition: A | Discharge status: Home / Self Care | Source: Ambulatory Visit | Attending: Adult Health | Admitting: Adult Health

## 2024-06-20 DIAGNOSIS — N926 Irregular menstruation, unspecified: Secondary | ICD-10-CM | POA: Diagnosis not present

## 2024-06-21 ENCOUNTER — Ambulatory Visit: Payer: Self-pay | Admitting: Adult Health

## 2024-06-21 ENCOUNTER — Other Ambulatory Visit (HOSPITAL_BASED_OUTPATIENT_CLINIC_OR_DEPARTMENT_OTHER): Payer: Self-pay

## 2024-06-21 LAB — BETA HCG QUANT (REF LAB): hCG Quant: <1 m[IU]/mL

## 2024-07-17 ENCOUNTER — Other Ambulatory Visit (HOSPITAL_BASED_OUTPATIENT_CLINIC_OR_DEPARTMENT_OTHER): Payer: Self-pay

## 2024-07-18 ENCOUNTER — Other Ambulatory Visit: Payer: Self-pay | Admitting: Adult Health

## 2024-07-18 DIAGNOSIS — Z3201 Encounter for pregnancy test, result positive: Secondary | ICD-10-CM

## 2024-07-18 NOTE — Progress Notes (Unsigned)
 Ck QHCG and progesterone  had +UPT

## 2024-07-19 ENCOUNTER — Other Ambulatory Visit (HOSPITAL_COMMUNITY)
Admission: RE | Admit: 2024-07-19 | Discharge: 2024-07-19 | Disposition: A | Discharge status: Home / Self Care | Source: Ambulatory Visit | Attending: Adult Health | Admitting: Adult Health

## 2024-07-19 DIAGNOSIS — Z3201 Encounter for pregnancy test, result positive: Secondary | ICD-10-CM | POA: Insufficient documentation

## 2024-07-20 LAB — BETA HCG QUANT (REF LAB): hCG Quant: 8 m[IU]/mL

## 2024-07-20 LAB — PROGESTERONE: Progesterone: 12.7 ng/mL

## 2024-07-21 ENCOUNTER — Ambulatory Visit: Payer: Self-pay | Admitting: Adult Health

## 2024-07-21 DIAGNOSIS — Z3201 Encounter for pregnancy test, result positive: Secondary | ICD-10-CM

## 2024-07-22 ENCOUNTER — Other Ambulatory Visit (HOSPITAL_COMMUNITY)
Admission: RE | Admit: 2024-07-22 | Discharge: 2024-07-22 | Disposition: A | Discharge status: Home / Self Care | Source: Ambulatory Visit | Attending: Adult Health | Admitting: Adult Health

## 2024-07-22 DIAGNOSIS — Z3201 Encounter for pregnancy test, result positive: Secondary | ICD-10-CM | POA: Insufficient documentation

## 2024-07-23 ENCOUNTER — Ambulatory Visit: Payer: Self-pay | Admitting: Adult Health

## 2024-07-23 LAB — BETA HCG QUANT (REF LAB): hCG Quant: 5 m[IU]/mL

## 2024-07-29 ENCOUNTER — Other Ambulatory Visit (HOSPITAL_BASED_OUTPATIENT_CLINIC_OR_DEPARTMENT_OTHER): Payer: Self-pay

## 2024-07-30 ENCOUNTER — Encounter (HOSPITAL_BASED_OUTPATIENT_CLINIC_OR_DEPARTMENT_OTHER): Payer: Self-pay

## 2024-07-30 ENCOUNTER — Other Ambulatory Visit (HOSPITAL_BASED_OUTPATIENT_CLINIC_OR_DEPARTMENT_OTHER): Payer: Self-pay

## 2024-08-01 ENCOUNTER — Ambulatory Visit: Admitting: Obstetrics & Gynecology

## 2024-08-01 ENCOUNTER — Encounter: Payer: Self-pay | Admitting: Obstetrics & Gynecology

## 2024-08-01 VITALS — BP 124/83 | HR 106 | Wt 129.4 lb

## 2024-08-01 DIAGNOSIS — Z3169 Encounter for other general counseling and advice on procreation: Secondary | ICD-10-CM | POA: Diagnosis not present

## 2024-08-01 DIAGNOSIS — Z3141 Encounter for fertility testing: Secondary | ICD-10-CM

## 2024-08-01 NOTE — Progress Notes (Signed)
   GYN VISIT Patient name: Haley Morales MRN [id number]  Date of birth: [birth year] Chief Complaint:   Follow-up (Discuss recurrent miscarriages)  History of Present Illness:   Haley Morales is a 33 y.o. G5P0101 female being seen today for the following concerns:  Infertility concerns: Notes that she has been sexually active with her partner for at least a year without contraception though it has been about 8 months of actively trying without success.  She notes 2 recent chemical pregnancies while on letrozole  these past few months over the summer.    While normally her menses were regular each month and around the same time from March until June she did not have any menses.  As mentioned, this has since resolved that she has had periods this past 2 months.  Patient has been seen by J.Griffin also treated with Clomid .  It seems as though the day 21 progesterone  was completed and findings suggest anovulation.    Of note patient has track menses at home and does [facility] predictor kits which do in turn suggest ovulation  Her daughter is 66 years old.  She had previously been on Depot- stopped for about a year before she conceived.   Review of Systems:   Pertinent items are noted in HPI Denies fever/chills, dizziness, headaches, visual disturbances, fatigue, shortness of breath, chest pain, abdominal pain, vomiting Pertinent History Reviewed:   Past Surgical History:  Procedure Laterality Date   BREAST ENHANCEMENT SURGERY Bilateral 04/2020    Past Medical History:  Diagnosis Date   Anxiety    Depression    HSV-1 (herpes simplex virus 1) infection    Migraines    Reviewed problem list, medications and allergies. Physical Assessment:   Vitals:   08/01/24 0904  BP: 124/83  Pulse: (!) 106  Weight: 129 lb 6.4 oz (58.7 kg)  Body mass index is 23.67 kg/m.       Physical Examination:   General appearance: alert, well appearing, and in no distress  Psych: mood appropriate,  normal affect  Skin: warm & dry   Cardiovascular: normal heart rate noted  Respiratory: normal respiratory effort, no distress  Chaperone: N/A    Assessment & Plan:  1) Infertility counseling/Fertility testing/Family planning - Based on clinical history it sounds as though patient is ovulating, will plan for day 3 lab work for further evaluation - Also recommended semen analysis and HSG for fertility testing - Discussed fecundity and recommended that patient give her body a chance as it seems as though it has been under 1 year of actively trying - Both she and her partner should continue on multivitamin-PNV daily - Further management pending results of testing  []  pt to call with menses to schedule HSG and through MyChart provide partners information for semen analysis   Orders Placed This Encounter  Procedures   DG Hysterogram (HSG)   Anti mullerian hormone   Estradiol    Follicle stimulating hormone   Luteinizing hormone   Prolactin   Testosterone   HgB A1c   Thyroid  Panel With TSH    Return for plan to schedule HSG.   Layann Bluett, DO Attending Obstetrician & Gynecologist, [facility] for [employer], [facility]

## 2024-08-06 ENCOUNTER — Other Ambulatory Visit (HOSPITAL_COMMUNITY): Payer: Self-pay

## 2024-08-08 ENCOUNTER — Encounter: Payer: Self-pay | Admitting: Obstetrics & Gynecology

## 2024-08-19 ENCOUNTER — Other Ambulatory Visit (HOSPITAL_BASED_OUTPATIENT_CLINIC_OR_DEPARTMENT_OTHER): Payer: Self-pay

## 2024-08-21 ENCOUNTER — Ambulatory Visit: Admitting: Obstetrics & Gynecology

## 2024-08-26 ENCOUNTER — Ambulatory Visit: Payer: Self-pay | Admitting: Obstetrics & Gynecology

## 2024-08-26 ENCOUNTER — Other Ambulatory Visit (HOSPITAL_COMMUNITY)
Admission: RE | Admit: 2024-08-26 | Discharge: 2024-08-26 | Disposition: A | Discharge status: Home / Self Care | Source: Ambulatory Visit | Attending: Obstetrics & Gynecology | Admitting: Obstetrics & Gynecology

## 2024-08-26 DIAGNOSIS — Z3141 Encounter for fertility testing: Secondary | ICD-10-CM | POA: Diagnosis present

## 2024-08-26 LAB — TSH: TSH: 0.429 u[IU]/mL (ref 0.350–4.500)

## 2024-08-26 LAB — HEMOGLOBIN A1C
Hgb A1c MFr Bld: 5.3 % (ref 4.8–5.6)
Mean Plasma Glucose: 105.41 mg/dL

## 2024-08-27 LAB — ESTRADIOL: Estradiol: 31 pg/mL

## 2024-08-27 LAB — TESTOSTERONE: Testosterone: 61 ng/dL (ref 13–71)

## 2024-08-27 LAB — PROLACTIN: Prolactin: 10.8 ng/mL (ref 4.8–33.4)

## 2024-08-27 LAB — FOLLICLE STIMULATING HORMONE: FSH: 6.5 m[IU]/mL

## 2024-08-30 ENCOUNTER — Encounter: Payer: Self-pay | Admitting: *Deleted

## 2024-08-30 LAB — MISC LABCORP TEST (SEND OUT): Labcorp test code: 500183

## 2024-09-03 LAB — LUTEINIZING HORMONE, PEDIATRIC: Luteinizing Hormone (LH) ECL: 3.9 m[IU]/mL

## 2024-09-11 ENCOUNTER — Other Ambulatory Visit: Payer: Self-pay | Admitting: Obstetrics & Gynecology

## 2024-09-11 ENCOUNTER — Other Ambulatory Visit (HOSPITAL_COMMUNITY)
Admission: RE | Admit: 2024-09-11 | Discharge: 2024-09-11 | Disposition: A | Discharge status: Home / Self Care | Source: Ambulatory Visit | Attending: Obstetrics & Gynecology | Admitting: Obstetrics & Gynecology

## 2024-09-11 ENCOUNTER — Other Ambulatory Visit (HOSPITAL_BASED_OUTPATIENT_CLINIC_OR_DEPARTMENT_OTHER): Payer: Self-pay

## 2024-09-11 DIAGNOSIS — Z3169 Encounter for other general counseling and advice on procreation: Secondary | ICD-10-CM | POA: Insufficient documentation

## 2024-09-11 MED ORDER — LEVOTHYROXINE SODIUM 25 MCG PO TABS
25.0000 ug | ORAL_TABLET | Freq: Every day | ORAL | 4 refills | Status: DC
  Filled 2024-09-11: qty 90, 90d supply, fill #0

## 2024-09-11 NOTE — Progress Notes (Signed)
 Called patient to review recent MyChart message - Plan for complete thyroid  panel for further evaluation - Reassured patient that testosterone level is within normal range  Kelie Gainey, DO Attending Obstetrician & Gynecologist, [facility] for [employer], [facility]

## 2024-09-12 ENCOUNTER — Ambulatory Visit: Payer: Self-pay | Admitting: Obstetrics & Gynecology

## 2024-09-12 LAB — MISC LABCORP TEST (SEND OUT): Labcorp test code: 620

## 2024-09-17 LAB — MISC LABCORP TEST (SEND OUT): Labcorp test code: 520018

## 2024-09-20 ENCOUNTER — Encounter: Payer: Self-pay | Admitting: Obstetrics & Gynecology

## 2024-09-25 ENCOUNTER — Encounter: Payer: Self-pay | Admitting: *Deleted

## 2024-10-01 ENCOUNTER — Ambulatory Visit (HOSPITAL_COMMUNITY)
Admission: RE | Admit: 2024-10-01 | Discharge: 2024-10-01 | Disposition: A | Source: Ambulatory Visit | Attending: Obstetrics & Gynecology

## 2024-10-01 ENCOUNTER — Encounter: Payer: Self-pay | Admitting: Obstetrics & Gynecology

## 2024-10-01 DIAGNOSIS — Z3141 Encounter for fertility testing: Secondary | ICD-10-CM | POA: Insufficient documentation

## 2024-10-01 DIAGNOSIS — N979 Female infertility, unspecified: Secondary | ICD-10-CM | POA: Diagnosis not present

## 2024-10-02 NOTE — Procedures (Signed)
 Sonohysterogram note:  Pre-operative Diagnosis: Infertility work up  Post-operative Diagnosis: same  Procedure Details   The risks (including infection, bleeding, pain, and uterine perforation) and benefits of the procedure were explained to the patient and Written informed consent was obtained.     The patient was placed in the dorsal lithotomy position.  A sterile speculum inserted in the vagina, and the cervix prepped with betadine.     A single tooth tenaculum was applied to the anterior lip of the cervix for stabilization.   An intrauterine catheter was inserted into the cervix without difficulty.  Speculum was removed.  The vaginal transducer was re-inserted and 10cc of saline solution was instilled under direct real-time observation.  Bilaterally patent fallopian tubes noted.  All instruments were removed.  Pt tolerated procedure without difficulty.    Condition: Stable  Complications: None  Andras Grunewald, DO Attending Obstetrician & Gynecologist, [facility] for [employer], [facility]

## 2024-10-03 ENCOUNTER — Ambulatory Visit: Payer: Self-pay | Admitting: Obstetrics & Gynecology

## 2024-10-18 ENCOUNTER — Encounter: Payer: Self-pay | Admitting: Obstetrics & Gynecology

## 2024-10-23 ENCOUNTER — Other Ambulatory Visit: Payer: Self-pay | Admitting: Obstetrics & Gynecology

## 2024-10-23 DIAGNOSIS — Z8751 Personal history of pre-term labor: Secondary | ICD-10-CM

## 2024-10-23 DIAGNOSIS — Z8759 Personal history of other complications of pregnancy, childbirth and the puerperium: Secondary | ICD-10-CM

## 2024-10-23 NOTE — Progress Notes (Signed)
 Orders for APS due to h/o preterm delivery  Haley Diekman, DO Attending Obstetrician & Gynecologist, [facility] for [facility], [facility]

## 2024-10-24 ENCOUNTER — Other Ambulatory Visit (HOSPITAL_COMMUNITY)
Admission: RE | Admit: 2024-10-24 | Discharge: 2024-10-24 | Disposition: A | Discharge status: Home / Self Care | Source: Ambulatory Visit | Attending: Obstetrics & Gynecology | Admitting: Obstetrics & Gynecology

## 2024-10-24 DIAGNOSIS — Z8759 Personal history of other complications of pregnancy, childbirth and the puerperium: Secondary | ICD-10-CM | POA: Diagnosis not present

## 2024-10-24 DIAGNOSIS — Z8751 Personal history of pre-term labor: Secondary | ICD-10-CM | POA: Diagnosis not present

## 2024-10-25 LAB — LUPUS ANTICOAGULANT
DRVVT: 36.7 s (ref 0.0–47.0)
PTT Lupus Anticoagulant: 33.1 s (ref 0.0–43.5)
Thrombin Time: 20 s (ref 0.0–23.0)
dPT Confirm Ratio: 1.14 ratio (ref 0.00–1.34)
dPT: 34.2 s (ref 0.0–47.6)

## 2024-10-26 ENCOUNTER — Ambulatory Visit: Payer: Self-pay | Admitting: Obstetrics & Gynecology

## 2024-10-26 LAB — CARDIOLIPIN ANTIBODIES, IGG, IGM, IGA
Anticardiolipin IgA: <9 U/mL (ref 0–11)
Anticardiolipin IgG: <9 GPL U/mL (ref 0–14)
Anticardiolipin IgM: <9 [MPL'U]/mL (ref 0–12)

## 2024-10-27 LAB — BETA-2-GLYCOPROTEIN I ABS, IGG/M/A
Beta-2 Glyco I IgG: <9 GPI IgG units (ref 0–20)
Beta-2-Glycoprotein I IgA: <9 GPI IgA units (ref 0–25)
Beta-2-Glycoprotein I IgM: <9 GPI IgM units (ref 0–32)

## 2024-11-02 ENCOUNTER — Encounter: Payer: Self-pay | Admitting: Obstetrics & Gynecology

## 2024-11-04 ENCOUNTER — Other Ambulatory Visit: Payer: Self-pay | Admitting: Obstetrics & Gynecology

## 2024-11-04 ENCOUNTER — Other Ambulatory Visit: Payer: Self-pay

## 2024-11-04 ENCOUNTER — Other Ambulatory Visit (HOSPITAL_COMMUNITY): Payer: Self-pay

## 2024-11-04 DIAGNOSIS — Z3009 Encounter for other general counseling and advice on contraception: Secondary | ICD-10-CM

## 2024-11-04 DIAGNOSIS — Z319 Encounter for procreative management, unspecified: Secondary | ICD-10-CM

## 2024-11-04 MED ORDER — RIMEGEPANT SULFATE 75 MG PO TBDP
75.0000 mg | ORAL_TABLET | Freq: Every day | ORAL | 0 refills | Status: AC
  Filled 2024-11-04: qty 8, 30d supply, fill #0

## 2024-11-04 NOTE — Progress Notes (Signed)
 Labs for progesterone   Haley Rosenberry, DO Attending Obstetrician & Gynecologist, [facility] for [facility], [facility]

## 2024-11-11 ENCOUNTER — Other Ambulatory Visit (HOSPITAL_COMMUNITY)
Admission: EM | Admit: 2024-11-11 | Discharge: 2024-11-11 | Disposition: A | Discharge status: Home / Self Care | Source: Ambulatory Visit | Attending: Obstetrics & Gynecology | Admitting: Obstetrics & Gynecology

## 2024-11-11 DIAGNOSIS — Z319 Encounter for procreative management, unspecified: Secondary | ICD-10-CM | POA: Diagnosis present

## 2024-11-11 DIAGNOSIS — Z3009 Encounter for other general counseling and advice on contraception: Secondary | ICD-10-CM | POA: Insufficient documentation

## 2024-11-12 LAB — PROGESTERONE: Progesterone: 12.6 ng/mL

## 2024-11-13 ENCOUNTER — Ambulatory Visit: Payer: Self-pay | Admitting: Obstetrics & Gynecology

## 2024-12-02 ENCOUNTER — Other Ambulatory Visit: Payer: Self-pay

## 2024-12-02 ENCOUNTER — Other Ambulatory Visit (HOSPITAL_COMMUNITY): Payer: Self-pay

## 2024-12-02 MED ORDER — NURTEC 75 MG PO TBDP
1.0000 | ORAL_TABLET | ORAL | 2 refills | Status: AC
  Filled 2024-12-02: qty 9, 30d supply, fill #0

## 2024-12-02 MED ORDER — BUPROPION HCL ER (XL) 150 MG PO TB24: 150.0000 mg | ORAL_TABLET | Freq: Two times a day (BID) | ORAL | 1 refills | Status: AC

## 2024-12-02 MED ORDER — ALPRAZOLAM 0.5 MG PO TABS
0.5000 mg | ORAL_TABLET | Freq: Every evening | ORAL | 0 refills | Status: AC | PRN
  Filled 2024-12-02: qty 30, 30d supply, fill #0

## 2024-12-02 MED ORDER — ONDANSETRON HCL 8 MG PO TABS
8.0000 mg | ORAL_TABLET | Freq: Three times a day (TID) | ORAL | 0 refills | Status: AC | PRN
  Filled 2024-12-02: qty 15, 5d supply, fill #0

## 2024-12-09 ENCOUNTER — Other Ambulatory Visit: Payer: Self-pay | Admitting: Obstetrics & Gynecology

## 2024-12-09 ENCOUNTER — Other Ambulatory Visit (HOSPITAL_BASED_OUTPATIENT_CLINIC_OR_DEPARTMENT_OTHER): Payer: Self-pay

## 2024-12-09 MED ORDER — PROGESTERONE 100 MG VA INST
100.0000 mg | VAGINAL_INSERT | Freq: Every evening | VAGINAL | 6 refills | Status: AC
  Filled 2024-12-09: qty 30, 30d supply, fill #0

## 2024-12-09 NOTE — Progress Notes (Signed)
 Per pt request- vaginal progesterone  supplementation

## 2024-12-10 ENCOUNTER — Other Ambulatory Visit: Payer: Self-pay | Admitting: Obstetrics & Gynecology

## 2024-12-10 ENCOUNTER — Other Ambulatory Visit (HOSPITAL_BASED_OUTPATIENT_CLINIC_OR_DEPARTMENT_OTHER): Payer: Self-pay

## 2024-12-10 DIAGNOSIS — Z3169 Encounter for other general counseling and advice on procreation: Secondary | ICD-10-CM

## 2024-12-10 NOTE — Progress Notes (Signed)
 Referral to [facility]
